# Patient Record
Sex: Female | Born: 1980 | Race: White | Hispanic: No | Smoking: Current every day smoker
Health system: Southern US, Community
[De-identification: ages and names within clinical notes are randomized; demographics above are authoritative.]

## PROBLEM LIST (undated history)

## (undated) DIAGNOSIS — F1120 Opioid dependence, uncomplicated: Secondary | ICD-10-CM

## (undated) DIAGNOSIS — N83209 Unspecified ovarian cyst, unspecified side: Secondary | ICD-10-CM

## (undated) HISTORY — PX: TUBAL LIGATION: SHX77

---

## 2004-01-19 ENCOUNTER — Emergency Department (HOSPITAL_COMMUNITY): Admission: EM | Admit: 2004-01-19 | Discharge: 2004-01-20 | Payer: Self-pay | Admitting: *Deleted

## 2005-05-10 ENCOUNTER — Emergency Department (HOSPITAL_COMMUNITY): Admission: EM | Admit: 2005-05-10 | Discharge: 2005-05-10 | Payer: Self-pay | Admitting: Emergency Medicine

## 2009-12-21 ENCOUNTER — Emergency Department (HOSPITAL_COMMUNITY): Admission: EM | Admit: 2009-12-21 | Discharge: 2009-12-21 | Payer: Self-pay | Admitting: Internal Medicine

## 2010-06-26 ENCOUNTER — Emergency Department (HOSPITAL_COMMUNITY)
Admission: EM | Admit: 2010-06-26 | Discharge: 2010-06-26 | Disposition: A | Discharge status: Home / Self Care | Payer: Self-pay | Attending: Emergency Medicine | Admitting: Emergency Medicine

## 2010-06-26 ENCOUNTER — Emergency Department (HOSPITAL_COMMUNITY): Payer: Self-pay

## 2010-06-26 DIAGNOSIS — R51 Headache: Secondary | ICD-10-CM | POA: Insufficient documentation

## 2010-06-26 DIAGNOSIS — R569 Unspecified convulsions: Secondary | ICD-10-CM | POA: Insufficient documentation

## 2010-06-26 LAB — DIFFERENTIAL
Basophils Absolute: 0 10*3/uL (ref 0.0–0.1)
Basophils Relative: 0 % (ref 0–1)
Eosinophils Relative: 5 % (ref 0–5)
Lymphocytes Relative: 32 % (ref 12–46)
Monocytes Absolute: 0.4 10*3/uL (ref 0.1–1.0)
Monocytes Relative: 6 % (ref 3–12)

## 2010-06-26 LAB — COMPREHENSIVE METABOLIC PANEL
ALT: 10 U/L (ref 0–35)
Albumin: 3.3 g/dL — ABNORMAL LOW (ref 3.5–5.2)
CO2: 25 mEq/L (ref 19–32)
Calcium: 8.6 mg/dL (ref 8.4–10.5)
Creatinine, Ser: 0.75 mg/dL (ref 0.4–1.2)
GFR calc Af Amer: >60 mL/min (ref >60)
Glucose, Bld: 91 mg/dL (ref 70–99)
Sodium: 134 mEq/L — ABNORMAL LOW (ref 135–145)

## 2010-06-26 LAB — URINALYSIS, ROUTINE W REFLEX MICROSCOPIC
Bilirubin Urine: NEGATIVE
Leukocytes, UA: NEGATIVE
Nitrite: NEGATIVE
Protein, ur: NEGATIVE mg/dL
Specific Gravity, Urine: 1.02 (ref 1.005–1.030)

## 2010-06-26 LAB — CBC
Hemoglobin: 12.6 g/dL (ref 12.0–15.0)
MCHC: 34.1 g/dL (ref 30.0–36.0)
MCV: 87.6 fL (ref 78.0–100.0)
Platelets: 239 10*3/uL (ref 150–400)
RDW: 12.5 % (ref 11.5–15.5)

## 2010-06-26 LAB — RAPID URINE DRUG SCREEN, HOSP PERFORMED
Amphetamines: NOT DETECTED
Opiates: NOT DETECTED

## 2010-06-26 LAB — URINE MICROSCOPIC-ADD ON

## 2010-08-09 ENCOUNTER — Other Ambulatory Visit (HOSPITAL_COMMUNITY): Payer: Self-pay | Admitting: Family Medicine

## 2010-08-09 DIAGNOSIS — M79661 Pain in right lower leg: Secondary | ICD-10-CM

## 2010-08-15 ENCOUNTER — Ambulatory Visit (HOSPITAL_COMMUNITY): Payer: Self-pay

## 2011-03-24 ENCOUNTER — Encounter: Payer: Self-pay | Admitting: *Deleted

## 2011-03-24 ENCOUNTER — Emergency Department (HOSPITAL_COMMUNITY)
Admission: EM | Admit: 2011-03-24 | Discharge: 2011-03-24 | Disposition: A | Discharge status: Home / Self Care | Payer: Self-pay | Attending: Emergency Medicine | Admitting: Emergency Medicine

## 2011-03-24 DIAGNOSIS — Z9851 Tubal ligation status: Secondary | ICD-10-CM | POA: Insufficient documentation

## 2011-03-24 DIAGNOSIS — M5430 Sciatica, unspecified side: Secondary | ICD-10-CM | POA: Insufficient documentation

## 2011-03-24 DIAGNOSIS — F17200 Nicotine dependence, unspecified, uncomplicated: Secondary | ICD-10-CM | POA: Insufficient documentation

## 2011-03-24 MED ORDER — CYCLOBENZAPRINE HCL 5 MG PO TABS: 5.0000 mg | ORAL_TABLET | Freq: Three times a day (TID) | ORAL | Status: AC | PRN

## 2011-03-24 MED ORDER — OXYCODONE-ACETAMINOPHEN 5-325 MG PO TABS
1.0000 | ORAL_TABLET | Freq: Once | ORAL | Status: AC
  Administered 2011-03-24: 1 via ORAL
  Filled 2011-03-24: qty 1

## 2011-03-24 MED ORDER — HYDROCODONE-ACETAMINOPHEN 5-325 MG PO TABS: 1.0000 | ORAL_TABLET | ORAL | Status: AC | PRN

## 2011-03-24 NOTE — ED Provider Notes (Signed)
History     CSN: [id number] Arrival date & time: 03/24/2011  4:05 PM   First MD Initiated Contact with Patient 03/24/11 1616      Chief Complaint  Patient presents with  . Back Pain    HPI Adrienne Levy is a 30 y.o. female who presents to the ED for lower back pain that radiates to the right hip. The pain started last night and is worse today. Denies loss of control over bladder or bowels. Has never had back and does not know of any injury other than playing leap frog with the children. The history was provided by the patient.  History reviewed. No pertinent past medical history.  Past Surgical History  Procedure Date  . Tubal ligation     History reviewed. No pertinent family history.  History  Substance Use Topics  . Smoking status: Current Everyday Smoker -- 1.0 packs/day  . Smokeless tobacco: Not on file  . Alcohol Use: No    OB History    Grav Para Term Preterm Abortions TAB SAB Ect Mult Living                  Review of Systems  Musculoskeletal: Positive for back pain.       Hip pain  All other systems reviewed and are negative.    Allergies  Toradol and Tramadol  Home Medications  No current outpatient prescriptions on file.  BP 132/111  Pulse 85  Temp 98.8 F (37.1 C)  Resp 20  Ht 5\' 5"  (1.651 m)  Wt 155 lb (70.308 kg)  BMI 25.79 kg/m2  SpO2 100%  LMP 03/10/2011  Physical Exam  Nursing note and vitals reviewed. Constitutional: She is oriented to person, place, and time. She appears well-developed and well-nourished.  HENT:  Head: Normocephalic and atraumatic.  Eyes: EOM are normal.  Neck: Neck supple.  Cardiovascular: Normal rate and regular rhythm.   Pulmonary/Chest: Effort normal and breath sounds normal.  Abdominal: Soft. There is no tenderness.  Musculoskeletal: Normal range of motion.       Back:       Pain with ROM lower back. Pain with palpation over sciatic nerve right. Pedal pulses +.  Neurological: She is alert and  oriented to person, place, and time. She has normal strength and normal reflexes. No cranial nerve deficit.  Skin: Skin is warm and dry.  Psychiatric: Her behavior is normal. Judgment and thought content normal.   Assessment:  Sciatica  Plan:  Flexeril 5 mg po tid   Hydrocodone   Follow up with PCP ED Course  Procedures   MDM          Kerrie Buffalo, NP 03/24/11 1629  [place], NP 03/24/11 1630

## 2011-03-24 NOTE — ED Notes (Signed)
Pt a/ox4. resp even and unlabored. NAD at this time. D/C instructions and rx reviewed with pt. Pt verbalized understanding. Pt ambulated to lobby with steady gate.

## 2011-03-24 NOTE — ED Notes (Signed)
Pt c/o lower back pain that radiates into her right hip. Pt denies injury.

## 2011-03-25 NOTE — ED Provider Notes (Signed)
Medical screening examination/treatment/procedure(s) were performed by non-physician practitioner and as supervising physician I was immediately available for consultation/collaboration.  Nicoletta Dress. Colon Branch, MD 03/25/11 1043

## 2011-07-15 ENCOUNTER — Encounter (HOSPITAL_COMMUNITY): Payer: Self-pay | Admitting: Emergency Medicine

## 2011-07-15 ENCOUNTER — Emergency Department (HOSPITAL_COMMUNITY)
Admission: EM | Admit: 2011-07-15 | Discharge: 2011-07-15 | Disposition: A | Discharge status: Home / Self Care | Payer: Self-pay | Attending: Physician Assistant | Admitting: Physician Assistant

## 2011-07-15 DIAGNOSIS — M545 Low back pain, unspecified: Secondary | ICD-10-CM | POA: Insufficient documentation

## 2011-07-15 DIAGNOSIS — S39012A Strain of muscle, fascia and tendon of lower back, initial encounter: Secondary | ICD-10-CM

## 2011-07-15 MED ORDER — ONDANSETRON HCL 4 MG PO TABS
4.0000 mg | ORAL_TABLET | Freq: Once | ORAL | Status: AC
  Administered 2011-07-15: 4 mg via ORAL
  Filled 2011-07-15: qty 1

## 2011-07-15 MED ORDER — HYDROCODONE-ACETAMINOPHEN 5-325 MG PO TABS
2.0000 | ORAL_TABLET | Freq: Once | ORAL | Status: AC
  Administered 2011-07-15: 2 via ORAL
  Filled 2011-07-15: qty 2

## 2011-07-15 MED ORDER — DIAZEPAM 5 MG PO TABS
5.0000 mg | ORAL_TABLET | Freq: Once | ORAL | Status: AC
  Administered 2011-07-15: 5 mg via ORAL
  Filled 2011-07-15: qty 1

## 2011-07-15 MED ORDER — BACLOFEN 10 MG PO TABS: 10.0000 mg | ORAL_TABLET | Freq: Three times a day (TID) | ORAL | Status: AC

## 2011-07-15 MED ORDER — HYDROCODONE-ACETAMINOPHEN 7.5-325 MG PO TABS: 1.0000 | ORAL_TABLET | ORAL | Status: AC | PRN

## 2011-07-15 MED ORDER — DEXAMETHASONE 4 MG PO TABS: ORAL_TABLET | ORAL | Status: AC

## 2011-07-15 NOTE — Discharge Instructions (Signed)
Please alternate heat and ice to the lower back. Please rest her back as much as possible. Decadron 4 mg daily, please take with food. Baclofen 3 times daily for spasm. Norco every 4 hours as needed for pain. Baclofen and Norco may cause drowsiness, please use with caution. If pain persist after treatment with the above listed medications, please see your primary physician or the orthopedic physician listed above for additional evaluation and assistance.Muscle Strain A muscle strain (pulled muscle) happens when a muscle is over-stretched. Recovery usually takes 5 to 6 weeks.  HOME CARE   Put ice on the injured area.   Put ice in a plastic bag.   Place a towel between your skin and the bag.   Leave the ice on for 15 to 20 minutes at a time, every hour for the first 2 days.   Do not use the muscle for several days or until your doctor says you can. Do not use the muscle if you have pain.   Wrap the injured area with an elastic bandage for comfort. Do not put it on too tightly.   Only take medicine as told by your doctor.   Warm up before exercise. This helps prevent muscle strains.  GET HELP RIGHT AWAY IF:  There is increased pain or puffiness (swelling) in the affected area. MAKE SURE YOU:   Understand these instructions.   Will watch your condition.   Will get help right away if you are not doing well or get worse.  Document Released: 01/24/2008 Document Revised: 04/05/2011 Document Reviewed: 01/24/2008 [facility] Patient Information 2012 [place], [place].

## 2011-07-15 NOTE — ED Notes (Signed)
Pt a/ox4. resp even and unlabored. NAD at this time. D/C instructions reviewed with pt. Pt verbalized understanding. Pt ambulated to lobby with steady gate.  

## 2011-07-15 NOTE — ED Notes (Signed)
Pt presents with flare up of low back pain that radiates down right leg. Pt ambulates with steady gate. No deformity or step-off noted. Pt sitting up on edge of stretcher. Stretcher in low locked position. Side rail up for pt safety. Call light within reach. Education on plan of care provided. Pt verbalized understanding. Awaiting PA evaluation. Will continue to monitor.

## 2011-07-15 NOTE — ED Notes (Signed)
Pt c/o lower back pain radiating down right leg.  

## 2011-07-22 NOTE — ED Provider Notes (Signed)
History     CSN: [id number]  Arrival date & time 07/15/11  1723   None     Chief Complaint  Patient presents with  . Back Pain    (Consider location/radiation/quality/duration/timing/severity/associated sxs/prior treatment) Patient is a 31 y.o. female presenting with back pain. The history is provided by the patient.  Back Pain  This is a new problem. The problem occurs daily. The pain is associated with no known injury. The pain is present in the lumbar spine. The quality of the pain is described as aching and burning. The pain radiates to the right thigh, right knee and right foot. The pain is severe. The symptoms are aggravated by certain positions. The pain is the same all the time. Pertinent negatives include no chest pain, no abdominal pain and no dysuria. She has tried nothing for the symptoms.    History reviewed. No pertinent past medical history.  Past Surgical History  Procedure Date  . Tubal ligation     History reviewed. No pertinent family history.  History  Substance Use Topics  . Smoking status: Current Everyday Smoker -- 1.0 packs/day  . Smokeless tobacco: Not on file  . Alcohol Use: No    OB History    Grav Para Term Preterm Abortions TAB SAB Ect Mult Living                  Review of Systems  Constitutional: Negative for activity change.       All ROS Neg except as noted in HPI  HENT: Negative for nosebleeds and neck pain.   Eyes: Negative for photophobia and discharge.  Respiratory: Negative for cough, shortness of breath and wheezing.   Cardiovascular: Negative for chest pain and palpitations.  Gastrointestinal: Negative for abdominal pain and blood in stool.  Genitourinary: Negative for dysuria, frequency and hematuria.  Musculoskeletal: Positive for back pain. Negative for arthralgias.  Skin: Negative.   Neurological: Negative for dizziness, seizures and speech difficulty.  Psychiatric/Behavioral: Negative for hallucinations and confusion.      Allergies  Toradol and Tramadol  Home Medications   Current Outpatient Rx  Name Route Sig Dispense Refill  . BACLOFEN 10 MG PO TABS Oral Take 1 tablet (10 mg total) by mouth 3 (three) times daily. 21 each 0  . DEXAMETHASONE 4 MG PO TABS  1 po daily with food 6 tablet 0  . HYDROCODONE-ACETAMINOPHEN 7.5-325 MG PO TABS Oral Take 1 tablet by mouth every 4 (four) hours as needed for pain. 20 tablet 0    BP 141/93  Pulse 100  Temp(Src) 97.3 F (36.3 C) (Oral)  Resp 20  Ht 5\' 6"  (1.676 m)  Wt 155 lb (70.308 kg)  BMI 25.02 kg/m2  SpO2 100%  LMP 07/01/2011  Physical Exam  Nursing note and vitals reviewed. Constitutional: She is oriented to person, place, and time. She appears well-developed and well-nourished.  Non-toxic appearance.  HENT:  Head: Normocephalic.  Right Ear: Tympanic membrane and external ear normal.  Left Ear: Tympanic membrane and external ear normal.  Eyes: EOM and lids are normal. Pupils are equal, round, and reactive to light.  Neck: Normal range of motion. Neck supple. Carotid bruit is not present.  Cardiovascular: Normal rate, regular rhythm, normal heart sounds, intact distal pulses and normal pulses.   Pulmonary/Chest: Breath sounds normal. No respiratory distress.  Abdominal: Soft. Bowel sounds are normal. There is no tenderness. There is no guarding.  Musculoskeletal: Normal range of motion.  Lumbar area pain to palpation. No palpable deformity.  Lymphadenopathy:       Head (right side): No submandibular adenopathy present.       Head (left side): No submandibular adenopathy present.    She has no cervical adenopathy.  Neurological: She is alert and oriented to person, place, and time. She has normal strength. No cranial nerve deficit or sensory deficit. She exhibits normal muscle tone. Coordination normal.  Skin: Skin is warm and dry.  Psychiatric: She has a normal mood and affect. Her speech is normal.    ED Course  Procedures (including  critical care time)  Labs Reviewed - No data to display No results found.   1. Lumbar strain       MDM  I have reviewed nursing notes, vital signs, and all appropriate lab and imaging results for this patient. No gross neurologic deficit appreciated on today's exam. Prescription for Decadron 4 mg, and Norco 7.5 mg given to the patient.       Kathie Dike, [place] 07/24/11 [phone]

## 2011-07-24 NOTE — ED Provider Notes (Signed)
Medical screening examination/treatment/procedure(s) were performed by non-physician practitioner and as supervising physician I was immediately available for consultation/collaboration.    Nelia Shi, MD 07/24/11 1242

## 2011-08-21 ENCOUNTER — Emergency Department (HOSPITAL_COMMUNITY)
Admission: EM | Admit: 2011-08-21 | Discharge: 2011-08-21 | Disposition: A | Discharge status: Home / Self Care | Payer: Self-pay | Attending: Emergency Medicine | Admitting: Emergency Medicine

## 2011-08-21 ENCOUNTER — Encounter (HOSPITAL_COMMUNITY): Payer: Self-pay | Admitting: *Deleted

## 2011-08-21 DIAGNOSIS — F17200 Nicotine dependence, unspecified, uncomplicated: Secondary | ICD-10-CM | POA: Insufficient documentation

## 2011-08-21 DIAGNOSIS — M545 Low back pain, unspecified: Secondary | ICD-10-CM | POA: Insufficient documentation

## 2011-08-21 DIAGNOSIS — R51 Headache: Secondary | ICD-10-CM | POA: Insufficient documentation

## 2011-08-21 MED ORDER — OXYCODONE-ACETAMINOPHEN 5-325 MG PO TABS: 1.0000 | ORAL_TABLET | Freq: Four times a day (QID) | ORAL | Status: AC | PRN

## 2011-08-21 MED ORDER — CYCLOBENZAPRINE HCL 10 MG PO TABS: 10.0000 mg | ORAL_TABLET | Freq: Two times a day (BID) | ORAL | Status: AC | PRN

## 2011-08-21 MED ORDER — HYDROMORPHONE HCL PF 2 MG/ML IJ SOLN
2.0000 mg | Freq: Once | INTRAMUSCULAR | Status: AC
  Administered 2011-08-21: 2 mg via INTRAMUSCULAR
  Filled 2011-08-21: qty 1

## 2011-08-21 NOTE — Discharge Instructions (Signed)
Back Pain, Adult Low back pain is very common. About 1 in 5 people have back pain.The cause of low back pain is rarely dangerous. The pain often gets better over time.About half of people with a sudden onset of back pain feel better in just 2 weeks. About 8 in 10 people feel better by 6 weeks.  CAUSES Some common causes of back pain include:  Strain of the muscles or ligaments supporting the spine.   Wear and tear (degeneration) of the spinal discs.   Arthritis.   Direct injury to the back.  DIAGNOSIS Most of the time, the direct cause of low back pain is not known.However, back pain can be treated effectively even when the exact cause of the pain is unknown.Answering your caregiver's questions about your overall health and symptoms is one of the most accurate ways to make sure the cause of your pain is not dangerous. If your caregiver needs more information, he or she may order lab work or imaging tests (X-rays or MRIs).However, even if imaging tests show changes in your back, this usually does not require surgery. HOME CARE INSTRUCTIONS For many people, back pain returns.Since low back pain is rarely dangerous, it is often a condition that people can learn to manageon their own.   Remain active. It is stressful on the back to sit or stand in one place. Do not sit, drive, or stand in one place for more than 30 minutes at a time. Take short walks on level surfaces as soon as pain allows.Try to increase the length of time you walk each day.   Do not stay in bed.Resting more than 1 or 2 days can delay your recovery.   Do not avoid exercise or work.Your body is made to move.It is not dangerous to be active, even though your back may hurt.Your back will likely heal faster if you return to being active before your pain is gone.   Pay attention to your body when you bend and lift. Many people have less discomfortwhen lifting if they bend their knees, keep the load close to their  bodies,and avoid twisting. Often, the most comfortable positions are those that put less stress on your recovering back.   Find a comfortable position to sleep. Use a firm mattress and lie on your side with your knees slightly bent. If you lie on your back, put a pillow under your knees.   Only take over-the-counter or prescription medicines as directed by your caregiver. Over-the-counter medicines to reduce pain and inflammation are often the most helpful.Your caregiver may prescribe muscle relaxant drugs.These medicines help dull your pain so you can more quickly return to your normal activities and healthy exercise.   Put ice on the injured area.   Put ice in a plastic bag.   Place a towel between your skin and the bag.   Leave the ice on for 15 to 20 minutes, 3 to 4 times a day for the first 2 to 3 days. After that, ice and heat may be alternated to reduce pain and spasms.   Ask your caregiver about trying back exercises and gentle massage. This may be of some benefit.   Avoid feeling anxious or stressed.Stress increases muscle tension and can worsen back pain.It is important to recognize when you are anxious or stressed and learn ways to manage it.Exercise is a great option.  SEEK MEDICAL CARE IF:  You have pain that is not relieved with rest or medicine.   You have   pain that does not improve in 1 week.   You have new symptoms.   You are generally not feeling well.  SEEK IMMEDIATE MEDICAL CARE IF:   You have pain that radiates from your back into your legs.   You develop new bowel or bladder control problems.   You have unusual weakness or numbness in your arms or legs.   You develop nausea or vomiting.   You develop abdominal pain.   You feel faint.  Document Released: 04/16/2005 Document Revised: 04/05/2011 Document Reviewed: 09/04/2010 [facility] Patient Information 2012 [place], [place].   RESOURCE GUIDE  Dental Problems  Patients with Medicaid: [facility] [phone]                                           [phone] [address] Phone:  [phone]                                                  Phone:  [phone]  If unable to pay or uninsured, contact:  [facility] or [facility] to become qualified for the adult dental clinic.  Chronic Pain Problems Contact [facility]  [phone] Patients need to be referred by their primary care doctor.  Insufficient Money for Medicine Contact [employer]:  call "211" or [facility] 712-800-8803.  No Primary Care Doctor Call [facility]  2195377323 Other agencies that provide inexpensive medical care    [facility]  [phone]    [facility]  4088316133    [facility]  [phone]    [facility]  [phone]    [facility]  [phone]    [facility]  [phone]  Psychological Services [facility]  [phone] [facility]  [phone] [facility]   [phone] (emergency services 7316097213)  Substance Abuse Resources [facility]  (339) 240-2317 [facility] 8728754214 [facility] 272-799-1471 Floydene Flock (707)[phone] Residential & Outpatient Substance Abuse Program  212-207-9615  Abuse/Neglect [facility] Child Abuse Hotline 614-409-5984 [facility] Child Abuse Hotline (782) 616-3002 (After Hours)  Emergency Shelter [facility] 702-635-8074  [facility] (256)808-9150 [facility] 435-624-6420  MRSA Hotline #:   604-388-2673    [facility] Resources  [facility]  [place]     United [employer]                          [facility] Dept. [address] [place]                       [address]      [address]  [address]  Cristobal Goldmann Phone:  [phone]                                   Phone:  [phone]                 Phone:  [phone]  [facility] Phone:  [phone]  [facility] Child Abuse Hotline 574-555-6021 218-491-4925 (After Hours)  Rest take medications as directed. Followup using the resource guide to find a primary care provider. Return for new or worse symptoms. Return for any bowel incontinence or radiation of pain into the lower extremities or weakness or numbness in the foot.

## 2011-08-21 NOTE — ED Provider Notes (Signed)
History   This chart was scribed for Adrienne Jakes, MD by Brooks Sailors. The patient was seen in room APA08/APA08. Patient's care was started at 2142.   CSN: [id number]  Arrival date & time 08/21/11  2142   First MD Initiated Contact with Patient 08/21/11 2201      Chief Complaint  Patient presents with  . Back Pain    (Consider location/radiation/quality/duration/timing/severity/associated sxs/prior treatment) HPI  Adrienne Levy is a 31 y.o. female who presents to the Emergency Department complaining of moderate to severe back pain onset two days ago and persistent since. Patient has had less severe intermittent back pain for the past two weeks. The pain is localized to the left lower back and does not radiate to any other areas. Patient denies injury to the back and denies any other medical problems.   History reviewed. No pertinent past medical history.  Past Surgical History  Procedure Date  . Tubal ligation     No family history on file.  History  Substance Use Topics  . Smoking status: Current Everyday Smoker -- 1.0 packs/day  . Smokeless tobacco: Not on file  . Alcohol Use: No    OB History    Grav Para Term Preterm Abortions TAB SAB Ect Mult Living                  Review of Systems  Constitutional: Negative for fever.  HENT: Negative for congestion, sore throat and neck pain.   Respiratory: Negative for cough.   Cardiovascular: Negative for chest pain.  Gastrointestinal: Negative for nausea, vomiting and diarrhea.  Genitourinary: Negative for dysuria.  Musculoskeletal: Positive for back pain. Negative for joint swelling.  Skin: Negative for rash.  Neurological: Positive for headaches.  Hematological: Does not bruise/bleed easily.  All other systems reviewed and are negative.    Allergies  Toradol and Tramadol  Home Medications   Current Outpatient Rx  Name Route Sig Dispense Refill  . ACETAMINOPHEN 325 MG PO TABS Oral Take 650 mg by  mouth as needed. For pain    . CYCLOBENZAPRINE HCL 10 MG PO TABS Oral Take 1 tablet (10 mg total) by mouth 2 (two) times daily as needed for muscle spasms. 20 tablet 0  . OXYCODONE-ACETAMINOPHEN 5-325 MG PO TABS Oral Take 1-2 tablets by mouth every 6 (six) hours as needed for pain. 14 tablet 0    BP 121/92  Pulse 94  Temp(Src) 97.8 F (36.6 C) (Oral)  Resp 20  Ht 5\' 5"  (1.651 m)  Wt 160 lb (72.576 kg)  BMI 26.63 kg/m2  SpO2 100%  LMP 07/30/2011  Physical Exam  Nursing note and vitals reviewed. Constitutional: She is oriented to person, place, and time. She appears well-developed and well-nourished.  HENT:  Head: Normocephalic and atraumatic.  Eyes: Conjunctivae and EOM are normal. Pupils are equal, round, and reactive to light.  Neck: Normal range of motion. Neck supple.  Cardiovascular: Normal rate, regular rhythm and normal heart sounds.  Exam reveals no gallop and no friction rub.   No murmur heard. Pulmonary/Chest: Effort normal and breath sounds normal. No respiratory distress. She has no wheezes. She has no rales.  Abdominal: Soft. Bowel sounds are normal.  Musculoskeletal: Normal range of motion. She exhibits tenderness (midline tenderness in lumbar area).       Mild Paraspinous tenderness in the right, more severe tenderness in the left.   Neurological: She is alert and oriented to person, place, and time.  Skin: Skin  is warm and dry.  Psychiatric: She has a normal mood and affect.    ED Course  Procedures (including critical care time) DIAGNOSTIC STUDIES: Oxygen Saturation is 100% on room air, normal by my interpretation.    COORDINATION OF CARE: 10:18PM Ordering pain medication.    Labs Reviewed - No data to display No results found. .this  1. Lumbar back pain       MDM  Patient with acute exacerbation of lumbar back pain. Starting 2 days ago pain mostly to the left side no distinct muscle spasm no lower extremity neurological deficits to be consistent  with sciatica. No radiation of pain into the lower extremities. Improves some with IM Dilaudid in the emergency department. Will discharge home with pain medication and Flexeril. Resource guide provided to patient find a primary care Dr. Andrey Cota she is not currently. Today's findings with no complicating factors no incontinence. Nothing consistent with cauda equina syndrome.      I personally performed the services described in this documentation, which was scribed in my presence. The recorded information has been reviewed and considered.     Adrienne Jakes, MD 08/21/11 [phone]

## 2011-08-21 NOTE — ED Notes (Signed)
Back pain x 2 days °

## 2011-10-05 ENCOUNTER — Emergency Department (HOSPITAL_COMMUNITY): Payer: Self-pay

## 2011-10-05 ENCOUNTER — Emergency Department (HOSPITAL_COMMUNITY)
Admission: EM | Admit: 2011-10-05 | Discharge: 2011-10-05 | Disposition: A | Discharge status: Home / Self Care | Payer: Self-pay | Attending: Emergency Medicine | Admitting: Emergency Medicine

## 2011-10-05 ENCOUNTER — Encounter (HOSPITAL_COMMUNITY): Payer: Self-pay | Admitting: *Deleted

## 2011-10-05 DIAGNOSIS — F17200 Nicotine dependence, unspecified, uncomplicated: Secondary | ICD-10-CM | POA: Insufficient documentation

## 2011-10-05 DIAGNOSIS — R1031 Right lower quadrant pain: Secondary | ICD-10-CM | POA: Insufficient documentation

## 2011-10-05 DIAGNOSIS — R109 Unspecified abdominal pain: Secondary | ICD-10-CM

## 2011-10-05 DIAGNOSIS — Z79899 Other long term (current) drug therapy: Secondary | ICD-10-CM | POA: Insufficient documentation

## 2011-10-05 HISTORY — DX: Unspecified ovarian cyst, unspecified side: N83.209

## 2011-10-05 LAB — COMPREHENSIVE METABOLIC PANEL WITH GFR
ALT: 16 U/L (ref 0–35)
AST: 21 U/L (ref 0–37)
Albumin: 3.8 g/dL (ref 3.5–5.2)
Alkaline Phosphatase: 71 U/L (ref 39–117)
BUN: 4 mg/dL — ABNORMAL LOW (ref 6–23)
CO2: 25 meq/L (ref 19–32)
Calcium: 9.3 mg/dL (ref 8.4–10.5)
Chloride: 102 meq/L (ref 96–112)
Creatinine, Ser: 0.64 mg/dL (ref 0.50–1.10)
GFR calc Af Amer: >90 mL/min
GFR calc non Af Amer: >90 mL/min
Glucose, Bld: 105 mg/dL — ABNORMAL HIGH (ref 70–99)
Potassium: 3.3 meq/L — ABNORMAL LOW (ref 3.5–5.1)
Sodium: 139 meq/L (ref 135–145)
Total Bilirubin: 0.3 mg/dL (ref 0.3–1.2)
Total Protein: 7.7 g/dL (ref 6.0–8.3)

## 2011-10-05 LAB — DIFFERENTIAL
Basophils Relative: 0 % (ref 0–1)
Eosinophils Relative: 3 % (ref 0–5)
Lymphs Abs: 2.9 10*3/uL (ref 0.7–4.0)
Monocytes Absolute: 0.4 10*3/uL (ref 0.1–1.0)
Monocytes Relative: 5 % (ref 3–12)
Neutrophils Relative %: 59 % (ref 43–77)

## 2011-10-05 LAB — URINE MICROSCOPIC-ADD ON

## 2011-10-05 LAB — CBC
HCT: 43.6 % (ref 36.0–46.0)
Hemoglobin: 15 g/dL (ref 12.0–15.0)
MCH: 30.4 pg (ref 26.0–34.0)
MCHC: 34.4 g/dL (ref 30.0–36.0)
MCV: 88.4 fL (ref 78.0–100.0)
Platelets: 303 K/uL (ref 150–400)
RBC: 4.93 MIL/uL (ref 3.87–5.11)
RDW: 12.7 % (ref 11.5–15.5)
WBC: 8.5 K/uL (ref 4.0–10.5)

## 2011-10-05 LAB — URINALYSIS, ROUTINE W REFLEX MICROSCOPIC
Bilirubin Urine: NEGATIVE
Ketones, ur: NEGATIVE mg/dL
Leukocytes, UA: NEGATIVE

## 2011-10-05 MED ORDER — IOHEXOL 300 MG/ML  SOLN
100.0000 mL | Freq: Once | INTRAMUSCULAR | Status: AC | PRN
  Administered 2011-10-05: 100 mL via INTRAVENOUS

## 2011-10-05 MED ORDER — ONDANSETRON HCL 4 MG/2ML IJ SOLN
4.0000 mg | Freq: Once | INTRAMUSCULAR | Status: AC
  Administered 2011-10-05: 4 mg via INTRAVENOUS
  Filled 2011-10-05: qty 2

## 2011-10-05 MED ORDER — MORPHINE SULFATE 2 MG/ML IJ SOLN
6.0000 mg | Freq: Once | INTRAMUSCULAR | Status: AC
  Administered 2011-10-05: 6 mg via INTRAVENOUS
  Filled 2011-10-05: qty 3

## 2011-10-05 MED ORDER — HYDROMORPHONE HCL PF 1 MG/ML IJ SOLN
1.0000 mg | Freq: Once | INTRAMUSCULAR | Status: AC
  Administered 2011-10-05: 1 mg via INTRAVENOUS
  Filled 2011-10-05: qty 1

## 2011-10-05 MED ORDER — OXYCODONE-ACETAMINOPHEN 5-325 MG PO TABS: 1.0000 | ORAL_TABLET | Freq: Four times a day (QID) | ORAL | Status: AC | PRN

## 2011-10-05 NOTE — Discharge Instructions (Signed)
Follow up with dr. eure next week. °

## 2011-10-05 NOTE — ED Notes (Signed)
Pt c/o right lower quad abd pain that started three days ago when she started her period increasing last night, pt denies any fever, n/v

## 2011-10-05 NOTE — ED Provider Notes (Signed)
This chart was scribed for [name], MD by Wallis Mart. The patient was seen in room APA11/APA11 and the patient's care was started at 3:19 PM.   CSN: [id number]  Arrival date & time 10/05/11  1450   First MD Initiated Contact with Patient 10/05/11 1515      Chief Complaint  Patient presents with  . Abdominal Pain    (Consider location/radiation/quality/duration/timing/severity/associated sxs/prior treatment) Patient is a 31 y.o. female presenting with abdominal pain.  Abdominal Pain The primary symptoms of the illness include abdominal pain. The primary symptoms of the illness do not include nausea, vomiting, diarrhea or dysuria. The current episode started more than 2 days ago. The onset of the illness was sudden. The problem has been gradually worsening.  Associated with: menstruation. The patient states that she believes she is currently not pregnant. The patient has not had a change in bowel habit. Symptoms associated with the illness do not include chills, urgency or frequency. Significant associated medical issues do not include GERD, diabetes or diverticulitis.    Adrienne Levy is a 31 y.o. female who presents to the Emergency Department complaining of sudden onset, persistence of constant, gradually worsening, moderate to severe RLQ abd pain that radiates nowhere. The pain started 3 days ago when pt started her period but increased last night.  Pt denies any trouble urinating.  Pt had an ovarian cyst w/ similar but milder pain. Pt denies vomiting, fever.  There are no other associated symptoms and no other alleviating or aggravating factors.   Past Medical History  Diagnosis Date  . Ovarian cyst     Past Surgical History  Procedure Date  . Tubal ligation     No family history on file.  History  Substance Use Topics  . Smoking status: Current Everyday Smoker -- 1.0 packs/day  . Smokeless tobacco: Not on file  . Alcohol Use: No    OB History    Grav  Para Term Preterm Abortions TAB SAB Ect Mult Living                  Review of Systems  Constitutional: Negative for chills.  Gastrointestinal: Positive for abdominal pain. Negative for nausea, vomiting and diarrhea.  Genitourinary: Negative for dysuria, urgency and frequency.  All other systems reviewed and are negative.    10 Systems reviewed and all are negative for acute change except as noted in the HPI.    Allergies  Ketorolac tromethamine and Tramadol  Home Medications   Current Outpatient Rx  Name Route Sig Dispense Refill  . ACETAMINOPHEN 325 MG PO TABS Oral Take 650 mg by mouth as needed. For pain      BP 145/101  Pulse 116  Temp(Src) 97.9 F (36.6 C) (Oral)  Resp 20  Ht 5\' 5"  (1.651 m)  Wt 160 lb (72.576 kg)  BMI 26.63 kg/m2  SpO2 100%  LMP 10/05/2011  Physical Exam  Nursing note and vitals reviewed. Constitutional: She is oriented to person, place, and time. She appears well-developed and well-nourished. No distress.  HENT:  Head: Normocephalic and atraumatic.  Eyes: EOM are normal. Pupils are equal, round, and reactive to light.  Neck: Normal range of motion. Neck supple. No tracheal deviation present.  Cardiovascular: Normal rate and regular rhythm.   Pulmonary/Chest: Effort normal and breath sounds normal. No respiratory distress.  Abdominal: Soft. She exhibits no distension. There is tenderness.       Moderate RLQ tenderness  Musculoskeletal: Normal range of motion.  She exhibits no edema.  Neurological: She is alert and oriented to person, place, and time. No sensory deficit.  Skin: Skin is warm and dry.  Psychiatric: She has a normal mood and affect. Her behavior is normal.  pelvic exam tender right adenexal  ED Course  Procedures (including critical care time)  DIAGNOSTIC STUDIES: Oxygen Saturation is 100% on room air, normal by my interpretation.    COORDINATION OF CARE:    Labs Reviewed - No data to display No results found.   No  diagnosis found.    MDM       [name], MD 10/05/11 [phone]

## 2011-10-08 LAB — GC/CHLAMYDIA PROBE AMP, GENITAL
Chlamydia, DNA Probe: NEGATIVE
GC Probe Amp, Genital: NEGATIVE

## 2011-11-13 ENCOUNTER — Emergency Department (HOSPITAL_COMMUNITY)
Admission: EM | Admit: 2011-11-13 | Discharge: 2011-11-13 | Disposition: A | Discharge status: Home / Self Care | Payer: Self-pay | Attending: Emergency Medicine | Admitting: Emergency Medicine

## 2011-11-13 ENCOUNTER — Emergency Department (HOSPITAL_COMMUNITY): Payer: Self-pay

## 2011-11-13 ENCOUNTER — Encounter (HOSPITAL_COMMUNITY): Payer: Self-pay

## 2011-11-13 DIAGNOSIS — R1031 Right lower quadrant pain: Secondary | ICD-10-CM | POA: Insufficient documentation

## 2011-11-13 DIAGNOSIS — N83209 Unspecified ovarian cyst, unspecified side: Secondary | ICD-10-CM | POA: Insufficient documentation

## 2011-11-13 DIAGNOSIS — N760 Acute vaginitis: Secondary | ICD-10-CM | POA: Insufficient documentation

## 2011-11-13 DIAGNOSIS — B9689 Other specified bacterial agents as the cause of diseases classified elsewhere: Secondary | ICD-10-CM | POA: Insufficient documentation

## 2011-11-13 DIAGNOSIS — A499 Bacterial infection, unspecified: Secondary | ICD-10-CM | POA: Insufficient documentation

## 2011-11-13 LAB — CBC WITH DIFFERENTIAL/PLATELET
Basophils Relative: 0 % (ref 0–1)
Eosinophils Absolute: 0.2 10*3/uL (ref 0.0–0.7)
Lymphs Abs: 3.1 10*3/uL (ref 0.7–4.0)
MCH: 30.3 pg (ref 26.0–34.0)
Neutrophils Relative %: 50 % (ref 43–77)
Platelets: 260 10*3/uL (ref 150–400)
RBC: 4.56 MIL/uL (ref 3.87–5.11)
WBC: 7.6 10*3/uL (ref 4.0–10.5)

## 2011-11-13 LAB — URINALYSIS, ROUTINE W REFLEX MICROSCOPIC
Bilirubin Urine: NEGATIVE
Glucose, UA: NEGATIVE mg/dL
Ketones, ur: NEGATIVE mg/dL
pH: 5.5 (ref 5.0–8.0)

## 2011-11-13 LAB — COMPREHENSIVE METABOLIC PANEL
ALT: 19 U/L (ref 0–35)
Albumin: 3.6 g/dL (ref 3.5–5.2)
Alkaline Phosphatase: 64 U/L (ref 39–117)
Glucose, Bld: 89 mg/dL (ref 70–99)
Potassium: 3.5 mEq/L (ref 3.5–5.1)
Sodium: 134 mEq/L — ABNORMAL LOW (ref 135–145)
Total Protein: 7.1 g/dL (ref 6.0–8.3)

## 2011-11-13 LAB — PREGNANCY, URINE: Preg Test, Ur: NEGATIVE

## 2011-11-13 MED ORDER — HYDROMORPHONE HCL PF 2 MG/ML IJ SOLN
INTRAMUSCULAR | Status: AC
  Filled 2011-11-13: qty 1

## 2011-11-13 MED ORDER — HYDROMORPHONE HCL PF 1 MG/ML IJ SOLN
1.0000 mg | Freq: Once | INTRAMUSCULAR | Status: AC
  Administered 2011-11-13: 1 mg via INTRAVENOUS
  Filled 2011-11-13: qty 1

## 2011-11-13 MED ORDER — SODIUM CHLORIDE 0.9 % IV SOLN
INTRAVENOUS | Status: DC
  Administered 2011-11-13: 16:00:00 via INTRAVENOUS

## 2011-11-13 MED ORDER — METRONIDAZOLE 500 MG PO TABS: 500.0000 mg | ORAL_TABLET | Freq: Two times a day (BID) | ORAL | Status: AC

## 2011-11-13 MED ORDER — OXYCODONE-ACETAMINOPHEN 5-325 MG PO TABS: 1.0000 | ORAL_TABLET | ORAL | Status: AC | PRN

## 2011-11-13 MED ORDER — HYDROMORPHONE HCL PF 2 MG/ML IJ SOLN
2.0000 mg | Freq: Once | INTRAMUSCULAR | Status: AC
  Administered 2011-11-13: 2 mg via INTRAVENOUS

## 2011-11-13 MED ORDER — METRONIDAZOLE 500 MG PO TABS
500.0000 mg | ORAL_TABLET | Freq: Once | ORAL | Status: AC
  Administered 2011-11-13: 500 mg via ORAL
  Filled 2011-11-13: qty 1

## 2011-11-13 MED ORDER — ONDANSETRON HCL 4 MG/2ML IJ SOLN
4.0000 mg | Freq: Once | INTRAMUSCULAR | Status: AC
  Administered 2011-11-13: 4 mg via INTRAVENOUS
  Filled 2011-11-13: qty 2

## 2011-11-13 NOTE — ED Notes (Signed)
Pt with RLQ pain severe x 2 days, first started this first week of the month per pt., denies N/V/D

## 2011-11-13 NOTE — ED Notes (Signed)
Patient transported to Ultrasound 

## 2011-11-13 NOTE — ED Provider Notes (Cosign Needed)
History    This chart was scribed for Adrienne Human, MD, MD by Smitty Pluck. The patient was seen in room APA10 and the patient's care was started at 3:36PM.   CSN: [id number]  Arrival date & time 11/13/11  1355   First MD Initiated Contact with Patient 11/13/11 1519      Chief Complaint  Patient presents with  . Abdominal Pain    (Consider location/radiation/quality/duration/timing/severity/associated sxs/prior treatment) Patient is a 31 y.o. female presenting with abdominal pain. The history is provided by the patient.  Abdominal Pain The primary symptoms of the illness include abdominal pain.   Adrienne Levy is a 31 y.o. female who presents to the Emergency Department complaining of moderate, constant stabbing RLQ pain onset 2 days. Pt reports taking percocet without relief. Denies vomiting, constipation, diarrhea, dysuria, nausea. Pt reports having LMP 1 week ago. Reports she had bad cramping during menstrual cycle. Reports that she normally in good health. Pt reports that she had tubal ligation in 2005. Pt is allergic to tramadol. Pt denies being on medication. Reports smoking 1 pack/day cigarettes. Denies alcohol and drug use. Denies fever, earache, sore throat, chest pain, cough, crash, syncope and seizures. Pt was seen in ED 10/05/11 for similar symptoms.   Past Medical History  Diagnosis Date  . Ovarian cyst     Past Surgical History  Procedure Date  . Tubal ligation     No family history on file.  History  Substance Use Topics  . Smoking status: Current Everyday Smoker -- 1.0 packs/day  . Smokeless tobacco: Not on file  . Alcohol Use: No    OB History    Grav Para Term Preterm Abortions TAB SAB Ect Mult Living                  Review of Systems  Gastrointestinal: Positive for abdominal pain.  All other systems reviewed and are negative.  10 Systems reviewed and all are negative for acute change except as noted in the HPI.    Allergies  Ketorolac  tromethamine and Tramadol  Home Medications   Current Outpatient Rx  Name Route Sig Dispense Refill  . ACETAMINOPHEN 325 MG PO TABS Oral Take 650 mg by mouth as needed. For pain    . OXYCODONE-ACETAMINOPHEN 5-325 MG PO TABS Oral Take 1 tablet by mouth as needed.      BP 119/90  Pulse 97  Temp 97.7 F (36.5 C) (Oral)  Resp 18  Ht 5\' 5"  (1.651 m)  Wt 160 lb (72.576 kg)  BMI 26.63 kg/m2  SpO2 98%  LMP 10/29/2011  Physical Exam  Nursing note and vitals reviewed. Constitutional: She is oriented to person, place, and time. She appears well-developed and well-nourished. No distress.  HENT:  Head: Normocephalic and atraumatic.  Eyes: EOM are normal. Pupils are equal, round, and reactive to light.  Neck: Normal range of motion. Neck supple. No tracheal deviation present.  Cardiovascular: Normal rate, regular rhythm and normal heart sounds.   Pulmonary/Chest: Effort normal. No respiratory distress.  Abdominal: Soft. She exhibits no distension. There is tenderness (RLQ).  Musculoskeletal: Normal range of motion.  Neurological: She is alert and oriented to person, place, and time.  Skin: Skin is warm and dry.  Psychiatric: She has a normal mood and affect. Her behavior is normal.    ED Course  Pelvic exam Date/Time: 11/13/2011 5:46 PM Performed by: Adrienne Levy Authorized by: Adrienne Levy Consent: Verbal consent obtained. Written consent not  obtained. Risks and benefits: risks, benefits and alternatives were discussed Consent given by: patient Patient understanding: patient states understanding of the procedure being performed Patient consent: the patient's understanding of the procedure matches consent given Patient identity confirmed: verbally with patient Time out: Immediately prior to procedure a "time out" was called to verify the correct patient, procedure, equipment, support staff and site/side marked as required. Local anesthesia used: no Patient sedated:  no Patient tolerance: Patient tolerated the procedure well with no immediate complications. Comments: BUS negative.  Bimanual shows mild right adnexal tenderness, no uterine tenderness.  Has white mucoid discharge.  Specimens submitted to GC, chlamydia, wet prep.   (including critical care time) DIAGNOSTIC STUDIES: Oxygen Saturation is 98% on room air, normal by my interpretation.    COORDINATION OF CARE: 3:40PM EDP discusses pt ED treatment with pt  4:00PM EDP medication: 0.9% NaCl infusion, dilaudid 1 mg, Zofran 4 mg   5:23 PM Results for orders placed during the hospital encounter of 11/13/11  CBC WITH DIFFERENTIAL      Component Value Range   WBC 7.6  4.0 - 10.5 K/uL   RBC 4.56  3.87 - 5.11 MIL/uL   Hemoglobin 13.8  12.0 - 15.0 g/dL   HCT 16.1  09.6 - 04.5 %   MCV 88.8  78.0 - 100.0 fL   MCH 30.3  26.0 - 34.0 pg   MCHC 34.1  30.0 - 36.0 g/dL   RDW 40.9  81.1 - 91.4 %   Platelets 260  150 - 400 K/uL   Neutrophils Relative 50  43 - 77 %   Neutro Abs 3.8  1.7 - 7.7 K/uL   Lymphocytes Relative 41  12 - 46 %   Lymphs Abs 3.1  0.7 - 4.0 K/uL   Monocytes Relative 6  3 - 12 %   Monocytes Absolute 0.5  0.1 - 1.0 K/uL   Eosinophils Relative 3  0 - 5 %   Eosinophils Absolute 0.2  0.0 - 0.7 K/uL   Basophils Relative 0  0 - 1 %   Basophils Absolute 0.0  0.0 - 0.1 K/uL  COMPREHENSIVE METABOLIC PANEL      Component Value Range   Sodium 134 (*) 135 - 145 mEq/L   Potassium 3.5  3.5 - 5.1 mEq/L   Chloride 100  96 - 112 mEq/L   CO2 27  19 - 32 mEq/L   Glucose, Bld 89  70 - 99 mg/dL   BUN 7  6 - 23 mg/dL   Creatinine, Ser 7.82  0.50 - 1.10 mg/dL   Calcium 9.0  8.4 - 95.6 mg/dL   Total Protein 7.1  6.0 - 8.3 g/dL   Albumin 3.6  3.5 - 5.2 g/dL   AST 20  0 - 37 U/L   ALT 19  0 - 35 U/L   Alkaline Phosphatase 64  39 - 117 U/L   Total Bilirubin 0.3  0.3 - 1.2 mg/dL   GFR calc non Af Amer >90  >90 mL/min   GFR calc Af Amer >90  >90 mL/min   US Transvaginal Non-ob  11/13/2011   *RADIOLOGY REPORT*  Clinical Data: Right lower quadrant pain  TRANSABDOMINAL AND TRANSVAGINAL ULTRASOUND OF PELVIS Technique:  Both transabdominal and transvaginal ultrasound examinations of the pelvis were performed. Transabdominal technique was performed for global imaging of the pelvis including uterus, ovaries, adnexal regions, and pelvic cul-de-sac.  It was necessary to proceed with endovaginal exam following the transabdominal exam to visualize the  endometrium.  Comparison:  None  Findings:  Uterus: Normal in size and appearance, measuring 7.3 x 4.6 x 4.8 cm.  Endometrium: Normal in thickness and appearance, measuring 10 mm.  Right ovary:  Normal appearance/no adnexal mass, measuring 2.9 x 2.3 x 2.0 cm, noting a 1.4 cm involuting corpus luteum on the right.  Left ovary: Normal appearance/no adnexal mass, measuring 1.8 x 1.6 x 3.0 cm.  Other findings: Trace free fluid.  IMPRESSION: Normal study. No evidence of pelvic mass or other significant abnormality.  1.4 cm involuting corpus luteal cyst on the right.  Original Report Authenticated By: Charline Bills, M.D.   US Pelvis Complete  11/13/2011  *RADIOLOGY REPORT*  Clinical Data: Right lower quadrant pain  TRANSABDOMINAL AND TRANSVAGINAL ULTRASOUND OF PELVIS Technique:  Both transabdominal and transvaginal ultrasound examinations of the pelvis were performed. Transabdominal technique was performed for global imaging of the pelvis including uterus, ovaries, adnexal regions, and pelvic cul-de-sac.  It was necessary to proceed with endovaginal exam following the transabdominal exam to visualize the endometrium.  Comparison:  None  Findings:  Uterus: Normal in size and appearance, measuring 7.3 x 4.6 x 4.8 cm.  Endometrium: Normal in thickness and appearance, measuring 10 mm.  Right ovary:  Normal appearance/no adnexal mass, measuring 2.9 x 2.3 x 2.0 cm, noting a 1.4 cm involuting corpus luteum on the right.  Left ovary: Normal appearance/no adnexal mass,  measuring 1.8 x 1.6 x 3.0 cm.  Other findings: Trace free fluid.  IMPRESSION: Normal study. No evidence of pelvic mass or other significant abnormality.  1.4 cm involuting corpus luteal cyst on the right.  Original Report Authenticated By: Charline Bills, M.D.    Ultrasound shows involuting right ovarian cyst. She is still complaining of pain.  Will remedicate, and then perform her pelvic exam.  5:48 PM Pelvic exam performed.  6:31 PM Wet prep shows clue cells.  Will treat her for bacterial vaginosis.     1. Ovarian cyst   2. Bacterial vaginosis      I personally performed the services described in this documentation, which was scribed in my presence. The recorded information has been reviewed and considered.  Adrienne Levy, M.D.     Carleene Cooper III, MD 11/13/11 [phone]

## 2011-11-13 NOTE — ED Notes (Signed)
MD at bedside. 

## 2011-11-13 NOTE — ED Notes (Signed)
Complain of pain in right lower quad since the first of July. Worse after period

## 2011-11-15 LAB — URINE CULTURE: Colony Count: 50000

## 2012-01-01 ENCOUNTER — Emergency Department (HOSPITAL_COMMUNITY)
Admission: EM | Admit: 2012-01-01 | Discharge: 2012-01-01 | Disposition: A | Discharge status: Home / Self Care | Payer: Self-pay | Attending: Emergency Medicine | Admitting: Emergency Medicine

## 2012-01-01 ENCOUNTER — Encounter (HOSPITAL_COMMUNITY): Payer: Self-pay | Admitting: *Deleted

## 2012-01-01 DIAGNOSIS — R21 Rash and other nonspecific skin eruption: Secondary | ICD-10-CM | POA: Insufficient documentation

## 2012-01-01 DIAGNOSIS — F17200 Nicotine dependence, unspecified, uncomplicated: Secondary | ICD-10-CM | POA: Insufficient documentation

## 2012-01-01 DIAGNOSIS — T7840XA Allergy, unspecified, initial encounter: Secondary | ICD-10-CM

## 2012-01-01 DIAGNOSIS — H5789 Other specified disorders of eye and adnexa: Secondary | ICD-10-CM | POA: Insufficient documentation

## 2012-01-01 MED ORDER — DEXAMETHASONE SODIUM PHOSPHATE 4 MG/ML IJ SOLN
10.0000 mg | Freq: Once | INTRAMUSCULAR | Status: AC
  Administered 2012-01-01: 10 mg via INTRAVENOUS
  Filled 2012-01-01: qty 3

## 2012-01-01 MED ORDER — OXYCODONE-ACETAMINOPHEN 5-325 MG PO TABS
1.0000 | ORAL_TABLET | Freq: Once | ORAL | Status: AC
  Administered 2012-01-01: 1 via ORAL
  Filled 2012-01-01: qty 1

## 2012-01-01 MED ORDER — PREDNISONE 50 MG PO TABS: ORAL_TABLET | ORAL | Status: AC

## 2012-01-01 MED ORDER — EPINEPHRINE 0.3 MG/0.3ML IJ DEVI: 0.3000 mg | INTRAMUSCULAR | Status: DC | PRN

## 2012-01-01 MED ORDER — DIPHENHYDRAMINE HCL 50 MG/ML IJ SOLN
50.0000 mg | Freq: Once | INTRAMUSCULAR | Status: AC
  Administered 2012-01-01: 50 mg via INTRAMUSCULAR
  Filled 2012-01-01: qty 1

## 2012-01-01 NOTE — ED Notes (Signed)
C/o rash to body area, swelling to facial area, trouble swallowing, pt states that the rash started last night, swelling today.

## 2012-01-01 NOTE — ED Notes (Addendum)
Pt c/o of rash full body, difficulty swallowing but breathing is regular, aching all over body with headache. Lungs sounds clear bilaterally. Eyes are swollen and pt states that right side of lips feel tight.

## 2012-01-01 NOTE — ED Notes (Signed)
Pt states that she took two tablets of benadryl at 9am.

## 2012-01-01 NOTE — ED Provider Notes (Addendum)
History  This chart was scribed for Joya Gaskins, MD by Bennett Scrape. This patient was seen in room APAH6/APAH6 and the patient's care was started at 5:03PM.  CSN: [id number]  Arrival date & time 01/01/12  1305   First MD Initiated Contact with Patient 01/01/12 1703      Chief Complaint  Patient presents with  . Facial Swelling  . Rash    Patient is a 31 y.o. female presenting with rash. The history is provided by the patient. No language interpreter was used.  Rash  This is a new problem. The current episode started 6 to 12 hours ago. The problem has not changed since onset.The problem is associated with nothing. There has been no fever. The rash is present on the abdomen, left upper leg, left lower leg, right upper leg and right lower leg. She has tried antihistamines for the symptoms. The treatment provided no relief.    Adrienne Levy is a 31 y.o. female who presents to the Emergency Department complaining of approximately 9 hours of gradual onset, gradually worsening, constant bilateral facial swelling with associated HA and rash on lower extremities and abdomen that she woke up with. She reports that she took Benadryl about 7 hours ago with no improvement in her symptoms. She denies any new medications, foods or personal care products. She denies any recent insect or tick bites. She denies having prior episodes of similar symptoms. She denies trouble swallowing, trouble breathing, drooling, involvement of the mouth or throat, diarrhea, SOB, and syncope as associated symptoms. She does not have a h/o chronic medical conditions. She is a current everyday smoker but denies alcohol use.  Past Medical History  Diagnosis Date  . Ovarian cyst     Past Surgical History  Procedure Date  . Tubal ligation     No family history on file.  History  Substance Use Topics  . Smoking status: Current Everyday Smoker -- 1.0 packs/day  . Smokeless tobacco: Not on file  . Alcohol Use:  No    No OB history provided.  Review of Systems  Constitutional: Negative for fever and chills.  HENT: Positive for facial swelling. Negative for trouble swallowing.   Respiratory: Negative for shortness of breath.   Skin: Positive for rash.  Neurological: Positive for headaches. Negative for syncope.    Allergies  Ketorolac tromethamine and Tramadol  Home Medications   Current Outpatient Rx  Name Route Sig Dispense Refill  . ACETAMINOPHEN 325 MG PO TABS Oral Take 650 mg by mouth as needed. For pain    . DIPHENHYDRAMINE HCL 25 MG PO TABS Oral Take 50 mg by mouth once as needed. For allergic reaction      Triage Vitals: BP 110/70  Pulse 87  Temp 97.6 F (36.4 C) (Oral)  Resp 20  Ht 5\' 5"  (1.651 m)  Wt 165 lb (74.844 kg)  BMI 27.46 kg/m2  SpO2 99%  LMP 01/01/2012  Physical Exam  Nursing note and vitals reviewed.   CONSTITUTIONAL: Well developed/well nourished HEAD AND FACE: Normocephalic/atraumatic, mild periorbital edema right greater than left EYES: EOMI/PERRL ENMT: Mucous membranes moist, no lip or tongue swelling, no stridor and no drooling Voice normal NECK: supple no meningeal signs SPINE:entire spine nontender CV: S1/S2 noted, no murmurs/rubs/gallops noted LUNGS: Lungs are clear to auscultation bilaterally, no apparent distress ABDOMEN: soft, nontender, no rebound or guarding GU:no cva tenderness NEURO: Pt is awake/alert, moves all extremitiesx4 EXTREMITIES: pulses normal, full ROM SKIN: warm, color normal, urticaria to  extremities and abdomen PSYCH: no abnormalities of mood noted   ED Course  Procedures   DIAGNOSTIC STUDIES: Oxygen Saturation is 99% on room air, normal by my interpretation.    COORDINATION OF CARE: 5:16PM-Discussed treatment plan which includes Benadryl, steroids and observation with pt at bedside and pt agreed to plan. Discussed discharge plan of Benadryl, steroids and epinephrine pen with pt and she agreed.  6:09PM-Pt  rechecked and reports that her symptoms have improved. Upon re-exam, the periorbital swelling and rash have improved. Reiterated my discharge instructions from before and gave the pt instructions on how to use the epi pen. She is comfortable being discharged home.    MDM  Nursing notes including past medical history and social history reviewed and considered in documentation   I personally performed the services described in this documentation, which was scribed in my presence. The recorded information has been reviewed and considered.          Joya Gaskins, MD 01/01/12 9811  Joya Gaskins, MD 01/01/12 [phone]

## 2012-01-01 NOTE — ED Notes (Signed)
Throat - pink, no swelling noted

## 2012-02-08 ENCOUNTER — Encounter (HOSPITAL_COMMUNITY): Payer: Self-pay | Admitting: *Deleted

## 2012-02-08 ENCOUNTER — Emergency Department (HOSPITAL_COMMUNITY)
Admission: EM | Admit: 2012-02-08 | Discharge: 2012-02-08 | Disposition: A | Discharge status: Home / Self Care | Payer: Self-pay | Attending: Emergency Medicine | Admitting: Emergency Medicine

## 2012-02-08 DIAGNOSIS — R109 Unspecified abdominal pain: Secondary | ICD-10-CM | POA: Insufficient documentation

## 2012-02-08 DIAGNOSIS — F1193 Opioid use, unspecified with withdrawal: Secondary | ICD-10-CM

## 2012-02-08 DIAGNOSIS — R51 Headache: Secondary | ICD-10-CM | POA: Insufficient documentation

## 2012-02-08 DIAGNOSIS — R000 Tachycardia, unspecified: Secondary | ICD-10-CM | POA: Insufficient documentation

## 2012-02-08 DIAGNOSIS — F1120 Opioid dependence, uncomplicated: Secondary | ICD-10-CM

## 2012-02-08 DIAGNOSIS — F1123 Opioid dependence with withdrawal: Secondary | ICD-10-CM

## 2012-02-08 DIAGNOSIS — F19939 Other psychoactive substance use, unspecified with withdrawal, unspecified: Secondary | ICD-10-CM | POA: Insufficient documentation

## 2012-02-08 DIAGNOSIS — R112 Nausea with vomiting, unspecified: Secondary | ICD-10-CM | POA: Insufficient documentation

## 2012-02-08 LAB — COMPREHENSIVE METABOLIC PANEL
ALT: 20 U/L (ref 0–35)
Alkaline Phosphatase: 81 U/L (ref 39–117)
CO2: 28 mEq/L (ref 19–32)
Chloride: 102 mEq/L (ref 96–112)
GFR calc Af Amer: >90 mL/min (ref >90)
GFR calc non Af Amer: >90 mL/min (ref >90)
Glucose, Bld: 118 mg/dL — ABNORMAL HIGH (ref 70–99)
Potassium: 3.9 mEq/L (ref 3.5–5.1)
Sodium: 139 mEq/L (ref 135–145)
Total Bilirubin: 0.3 mg/dL (ref 0.3–1.2)

## 2012-02-08 LAB — RAPID URINE DRUG SCREEN, HOSP PERFORMED
Barbiturates: NOT DETECTED
Benzodiazepines: NOT DETECTED

## 2012-02-08 LAB — URINALYSIS, ROUTINE W REFLEX MICROSCOPIC
Ketones, ur: NEGATIVE mg/dL
Nitrite: NEGATIVE
Specific Gravity, Urine: 1.01 (ref 1.005–1.030)
pH: 7 (ref 5.0–8.0)

## 2012-02-08 LAB — POCT PREGNANCY, URINE: Preg Test, Ur: NEGATIVE

## 2012-02-08 LAB — URINE MICROSCOPIC-ADD ON

## 2012-02-08 LAB — CBC WITH DIFFERENTIAL/PLATELET
Eosinophils Relative: 1 % (ref 0–5)
HCT: 44 % (ref 36.0–46.0)
Lymphocytes Relative: 29 % (ref 12–46)
Lymphs Abs: 2.9 10*3/uL (ref 0.7–4.0)
MCV: 90.9 fL (ref 78.0–100.0)
Monocytes Absolute: 0.5 10*3/uL (ref 0.1–1.0)
Platelets: 311 10*3/uL (ref 150–400)
RBC: 4.84 MIL/uL (ref 3.87–5.11)
WBC: 10 10*3/uL (ref 4.0–10.5)

## 2012-02-08 MED ORDER — ALUM & MAG HYDROXIDE-SIMETH 200-200-20 MG/5ML PO SUSP: 30.0000 mL | ORAL | Status: DC | PRN

## 2012-02-08 MED ORDER — LORAZEPAM 1 MG PO TABS
1.0000 mg | ORAL_TABLET | Freq: Three times a day (TID) | ORAL | Status: DC | PRN
  Administered 2012-02-08: 1 mg via ORAL
  Filled 2012-02-08: qty 1

## 2012-02-08 MED ORDER — ACETAMINOPHEN 325 MG PO TABS
650.0000 mg | ORAL_TABLET | ORAL | Status: DC | PRN
  Administered 2012-02-08: 650 mg via ORAL
  Filled 2012-02-08: qty 2

## 2012-02-08 MED ORDER — IBUPROFEN 400 MG PO TABS: 600.0000 mg | ORAL_TABLET | Freq: Three times a day (TID) | ORAL | Status: DC | PRN

## 2012-02-08 MED ORDER — ONDANSETRON HCL 4 MG PO TABS: 4.0000 mg | ORAL_TABLET | Freq: Three times a day (TID) | ORAL | Status: DC | PRN

## 2012-02-08 MED ORDER — LOPERAMIDE HCL 2 MG PO CAPS: 2.0000 mg | ORAL_CAPSULE | ORAL | Status: DC | PRN

## 2012-02-08 MED ORDER — METHOCARBAMOL 500 MG PO TABS: 500.0000 mg | ORAL_TABLET | Freq: Three times a day (TID) | ORAL | Status: DC | PRN

## 2012-02-08 MED ORDER — DICYCLOMINE HCL 10 MG PO CAPS: 20.0000 mg | ORAL_CAPSULE | Freq: Four times a day (QID) | ORAL | Status: DC | PRN

## 2012-02-08 MED ORDER — ZOLPIDEM TARTRATE 5 MG PO TABS: 10.0000 mg | ORAL_TABLET | Freq: Every evening | ORAL | Status: DC | PRN

## 2012-02-08 MED ORDER — ONDANSETRON 4 MG PO TBDP: 4.0000 mg | ORAL_TABLET | Freq: Four times a day (QID) | ORAL | Status: DC | PRN

## 2012-02-08 MED ORDER — NAPROXEN 250 MG PO TABS: 500.0000 mg | ORAL_TABLET | Freq: Two times a day (BID) | ORAL | Status: DC | PRN

## 2012-02-08 MED ORDER — HYDROXYZINE HCL 25 MG PO TABS
25.0000 mg | ORAL_TABLET | Freq: Four times a day (QID) | ORAL | Status: DC | PRN
  Administered 2012-02-08: 25 mg via ORAL
  Filled 2012-02-08: qty 1

## 2012-02-08 MED ORDER — NICOTINE 21 MG/24HR TD PT24
21.0000 mg | MEDICATED_PATCH | Freq: Every day | TRANSDERMAL | Status: DC
  Administered 2012-02-08: 21 mg via TRANSDERMAL
  Filled 2012-02-08: qty 1

## 2012-02-08 NOTE — ED Notes (Signed)
Pt stated she was not hungry and her dinner tray was given to her mother at this time. Adrienne Levy

## 2012-02-08 NOTE — Progress Notes (Signed)
PT ACCEPTED TO [facility] BY [facility]. CALLED [facility] AND GOT AUTHORIZATION NO. FROM ANGELA [id number] 3 DAYS REVIEW ON 02/10/12.  PT'S MOTHER WILL TRANSPORT.

## 2012-02-08 NOTE — ED Notes (Signed)
Says she wants detox from pain meds.  Alert, vomiting, headache,  "muscles jumping and skin crawling"

## 2012-02-08 NOTE — BH Assessment (Signed)
Assessment Note   Adrienne Levy is an 31 y.o. female. PT PRESENTS TO THE ED SEEKING DETOX FROM OPIATES.  PT REPORTS SHE STARTED USING PAIN PILLS 2 1/2 YEARS AGO AND HAD DETOX AFTER ABOUT 6 MOS USE AND REMAINED SOBER FOR 29 DAYS AND SINCE MAY 2012 SHE HAS BEEN USING ABOUT 4 PILLS PER DAY OF OXYCONTIN OR ROXICET OR PERCOCET OR MORPHINE.  SHE LAST USED YESTERDAY AND IS HAVING WITHDRAWALS.  SHE DENIES S/I, H/I AND IS NOT PSYCHOTIC. PT DENIES MEDICAL PROBLEMS AND IS NOT ON ANY MEDICATIONS.  Axis I: OPIATE DEPENDENCY Axis II: Deferred Axis III:  Past Medical History  Diagnosis Date  . Ovarian cyst    Axis IV: problems related to social environment Axis V: 41-50 serious symptoms        Past Medical History:  Past Medical History  Diagnosis Date  . Ovarian cyst     Past Surgical History  Procedure Date  . Tubal ligation     Family History:  Family History  Problem Relation Age of Onset  . Rashes / Skin problems Mother     Social History:  reports that she has been smoking.  She does not have any smokeless tobacco history on file. She reports that she does not drink alcohol or use illicit drugs.  Additional Social History:  Alcohol / Drug Use Pain Medications: YES Prescriptions: NA Over the Counter: NA History of alcohol / drug use?: Yes Substance #1 Name of Substance 1: OPIATES 1 - Age of First Use: 27 1 - Amount (size/oz): 4 PILLS  1 - Frequency: DAILY 1 - Duration: 16 MONTHS  CIWA: CIWA-Ar BP: 129/92 mmHg Pulse Rate: 92  COWS: Clinical Opiate Withdrawal Scale (COWS) Resting Pulse Rate: Pulse Rate 81-100 Sweating: Subjective report of chills or flushing Restlessness: Frequent shifting or extraneous movements of legs/arms Pupil Size: Pupils possibly larger than normal for room light Bone or Joint Aches: Mild diffuse discomfort Runny Nose or Tearing: Not present GI Upset: nausea or loose stool Tremor: Slight tremor observable Yawning: No yawning Anxiety or  Irritability: Patient obviously irritable/anxious Gooseflesh Skin: Piloerection of skin can be felt or hairs standing up on arms COWS Total Score: 16   Allergies:  Allergies  Allergen Reactions  . Ketorolac Tromethamine Shortness Of Breath  . Tramadol Hives    Home Medications:  (Not in a hospital admission)  OB/GYN Status:  Patient's last menstrual period was 01/20/2012.  General Assessment Data Location of Assessment: [place] [facility] Assessment Services ACT Assessment: Yes Living Arrangements: Parent (Adrienne Levy) Can pt return to current living arrangement?: Yes Admission Status: Voluntary Is patient capable of signing voluntary admission?: Yes Transfer from: Acute Hospital [facility]) Referral Source: MD (DR IVA KNAPP)  Education Status Contact person: Adrienne Levy  Risk to self Suicidal Ideation: No Suicidal Intent: No Is patient at risk for suicide?: No Suicidal Plan?: No Access to Means: No What has been your use of drugs/alcohol within the last 12 months?: OPIATES Previous Attempts/Gestures: No How many times?: 0  Other Self Harm Risks: NA Triggers for Past Attempts: None known Intentional Self Injurious Behavior: None Family Suicide History: No Recent stressful life event(s): Other (Comment) (CAN'T STOP ON HER OWN) Persecutory voices/beliefs?: No Depression: No Substance abuse history and/or treatment for substance abuse?: No Suicide prevention information given to non-admitted patients: Not applicable  Risk to Others Homicidal Ideation: No Thoughts of Harm to Others: No Current Homicidal Intent: No Current Homicidal Plan: No Access to Homicidal Means: No  History of harm to others?: No Assessment of Violence: None Noted Violent Behavior Description: NA Does patient have access to weapons?: No Criminal Charges Pending?: No Does patient have a court date: No  Psychosis Hallucinations: None noted Delusions: None  noted  Mental Status Report Appear/Hygiene: Improved Eye Contact: Good Motor Activity: Freedom of movement Speech: Logical/coherent Level of Consciousness: Alert Mood: Irritable Affect: Appropriate to circumstance Anxiety Level: Moderate Thought Processes: Coherent;Relevant Judgement: Unimpaired Orientation: Person;Situation;Place;Time Obsessive Compulsive Thoughts/Behaviors: None  Cognitive Functioning Concentration: Normal Memory: Recent Intact;Remote Intact IQ: Average Insight: Fair Impulse Control: Fair Appetite: Fair Sleep: Decreased Total Hours of Sleep: 0  (X 2 NIGHTS) Vegetative Symptoms: None  ADLScreening [facility] Services) Patient's cognitive ability adequate to safely complete daily activities?: Yes Patient able to express need for assistance with ADLs?: Yes Independently performs ADLs?: Yes (appropriate for developmental age)  Abuse/Neglect [facility]) Physical Abuse: Denies Verbal Abuse: Denies Sexual Abuse: Denies  Prior Inpatient Therapy Prior Inpatient Therapy: Yes Prior Therapy Dates: MARCH 2012 Prior Therapy Facilty/Provider(s): [facility] Reason for Treatment: DETOX  Prior Outpatient Therapy Prior Outpatient Therapy: No  ADL Screening (condition at time of admission) Patient's cognitive ability adequate to safely complete daily activities?: Yes Patient able to express need for assistance with ADLs?: Yes Independently performs ADLs?: Yes (appropriate for developmental age) Weakness of Legs: None Weakness of Arms/Hands: None  Home Assistive Devices/Equipment Home Assistive Devices/Equipment: None  Therapy Consults (therapy consults require a physician order) PT Evaluation Needed: No OT Evalulation Needed: No SLP Evaluation Needed: No Abuse/Neglect Assessment (Assessment to be complete while patient is alone) Physical Abuse: Denies Verbal Abuse: Denies Sexual Abuse: Denies Exploitation of patient/patient's resources: Denies Self-Neglect:  Denies Values / Beliefs Cultural Requests During Hospitalization: None Spiritual Requests During Hospitalization: None Consults Spiritual Care Consult Needed: No Social Work Consult Needed: No Merchant navy officer (For Healthcare) Advance Directive: Patient does not have advance directive;Patient would not like information Pre-existing out of facility DNR order (yellow form or pink MOST form): No    Additional Information 1:1 In Past 12 Months?: No CIRT Risk: No Elopement Risk: No Does patient have medical clearance?: Yes     Disposition: REFERRED TO [facility] Disposition Disposition of Patient: Referred to Patient referred to: [facility]  On Site Evaluation by:   Reviewed with Physician:  DR IVA Clarene Duke Winford 02/08/2012 9:00 PM

## 2012-02-08 NOTE — ED Provider Notes (Cosign Needed)
History  This chart was scribed for Ward Givens, MD by Erskine Emery. This patient was seen in room APA17/APA17 and the patient's care was started at 16:28.   CSN: [id number]  Arrival date & time 02/08/12  1339   First MD Initiated Contact with Patient 02/08/12 1628      No chief complaint on file.   (Consider location/radiation/quality/duration/timing/severity/associated sxs/prior treatment) The history is provided by the patient. No language interpreter was used.  Adrienne Levy is a 31 y.o. female who presents to the Emergency Department complaining of voluntary withdrawal from pain medicine for the past 2 days. Pt reports some assocated abdominal cramps, nausea, emesis, skin crawling sensatins, and headaches but denies any associated depression, suicidal ideation, chest pain, or SOB.  Pt reports she is sick of the chase, she has been abusing pain pills for several years, since her late 11s. Pt typically took whatever she could get her hands on, for example: a couple roxicodone (30mg ), oxy, and morphine opana. Pt reports she got the pills from friends and would snort them or inject them into her right arm. Pt was in detox about a year and a half ago at [facility]; she was sober for about 29 days but did not got to Narcotics Anonymous and relapsed when she started hanging around with old drug buddies.  She denies depression, SI or HI. She denies having criminal charges.   Past Medical History  Diagnosis Date  . Ovarian cyst     Past Surgical History  Procedure Date  . Tubal ligation     Family History  Problem Relation Age of Onset  . Rashes / Skin problems Mother     History  Substance Use Topics  . Smoking status: Current Every Day Smoker -- 1.0 packs/day  . Smokeless tobacco: Not on file  . Alcohol Use: No   Pt reports she smokes about a pack of cigarettes/day. Pt denies drinking. Pt does not work, she lives with her mother and sister (who also abuses pain medication).  OB  History    Grav Para Term Preterm Abortions TAB SAB Ect Mult Living                  Review of Systems  Constitutional: Negative for fever and chills.  HENT: Negative for congestion.   Eyes: Negative for discharge.  Respiratory: Negative for shortness of breath.   Cardiovascular: Negative for chest pain.  Gastrointestinal: Positive for nausea, vomiting and abdominal pain.  Genitourinary: Negative for hematuria.  Skin: Negative for rash.  Neurological: Positive for headaches. Negative for weakness.  Psychiatric/Behavioral: Negative for suicidal ideas.  All other systems reviewed and are negative.    Allergies  Ketorolac tromethamine and Tramadol  Home Medications   Current Outpatient Rx  Name Route Sig Dispense Refill  . ACETAMINOPHEN 325 MG PO TABS Oral Take 650 mg by mouth as needed. For pain    . DIPHENHYDRAMINE HCL 25 MG PO TABS Oral Take 50 mg by mouth once as needed. For allergic reaction    . EPINEPHRINE 0.3 MG/0.3ML IJ DEVI Intramuscular Inject 0.3 mLs (0.3 mg total) into the muscle as needed. 2 Device 1    Triage Vitals: BP 144/85  Pulse 85  Temp 98.5 F (36.9 C) (Oral)  Resp 18  Ht 5\' 5"  (1.651 m)  Wt 165 lb (74.844 kg)  BMI 27.46 kg/m2  SpO2 98%  LMP 01/20/2012  Vital signs normal    Physical Exam  Nursing note and vitals  reviewed. Constitutional: She is oriented to person, place, and time. She appears well-developed and well-nourished.  Non-toxic appearance. She does not appear ill. No distress.  HENT:  Head: Normocephalic and atraumatic.  Right Ear: External ear normal.  Left Ear: External ear normal.  Nose: Nose normal. No mucosal edema or rhinorrhea.  Mouth/Throat: Mucous membranes are normal. No dental abscesses or uvula swelling.       Tongue is dry  Eyes: Conjunctivae normal and EOM are normal. Pupils are equal, round, and reactive to light.  Neck: Normal range of motion and full passive range of motion without pain. Neck supple.    Cardiovascular: Regular rhythm and normal heart sounds.  Tachycardia present.  Exam reveals no gallop and no friction rub.   No murmur heard. Pulmonary/Chest: Effort normal and breath sounds normal. No respiratory distress. She has no wheezes. She has no rhonchi. She has no rales. She exhibits no tenderness and no crepitus.  Abdominal: Soft. Normal appearance and bowel sounds are normal. She exhibits no distension. There is no tenderness. There is no rebound and no guarding.  Musculoskeletal: Normal range of motion. She exhibits no edema and no tenderness.       Moves all extremities well.   Neurological: She is alert and oriented to person, place, and time. She has normal strength. No cranial nerve deficit.  Skin: Skin is warm, dry and intact. No rash noted. No erythema. No pallor.       Has fresh track marks in her right antecubital and  Volar wrist  Psychiatric: She has a normal mood and affect. Her speech is normal and behavior is normal. Her mood appears not anxious.       Jittery, has trouble holding still.    ED Course  Procedures (including critical care time) DIAGNOSTIC STUDIES: Oxygen Saturation is 98% on room air, normal by my interpretation.    COORDINATION OF CARE: 17:13--I evaluated the patient and we discussed a treatment plan including psychiatric consult to which the pt agreed.   17:40--Consulted with Samson Frederic (ACT).  19:09--I rechecked the pt who has been given ativan but has not yet had all of the medications ordered.   22:00 Ella, ACT, states accepted at [facility], [facility] can transport   Results for orders placed during the hospital encounter of 02/08/12  CBC WITH DIFFERENTIAL      Component Value Range   WBC 10.0  4.0 - 10.5 K/uL   RBC 4.84  3.87 - 5.11 MIL/uL   Hemoglobin 15.0  12.0 - 15.0 g/dL   HCT 16.1  09.6 - 04.5 %   MCV 90.9  78.0 - 100.0 fL   MCH 31.0  26.0 - 34.0 pg   MCHC 34.1  30.0 - 36.0 g/dL   RDW 40.9  81.1 - 91.4 %   Platelets 311  150 - 400 K/uL    Neutrophils Relative 65  43 - 77 %   Neutro Abs 6.5  1.7 - 7.7 K/uL   Lymphocytes Relative 29  12 - 46 %   Lymphs Abs 2.9  0.7 - 4.0 K/uL   Monocytes Relative 5  3 - 12 %   Monocytes Absolute 0.5  0.1 - 1.0 K/uL   Eosinophils Relative 1  0 - 5 %   Eosinophils Absolute 0.1  0.0 - 0.7 K/uL   Basophils Relative 0  0 - 1 %   Basophils Absolute 0.0  0.0 - 0.1 K/uL  COMPREHENSIVE METABOLIC PANEL      Component Value  Range   Sodium 139  135 - 145 mEq/L   Potassium 3.9  3.5 - 5.1 mEq/L   Chloride 102  96 - 112 mEq/L   CO2 28  19 - 32 mEq/L   Glucose, Bld 118 (*) 70 - 99 mg/dL   BUN 5 (*) 6 - 23 mg/dL   Creatinine, Ser 1.61  0.50 - 1.10 mg/dL   Calcium 09.6  8.4 - 04.5 mg/dL   Total Protein 7.6  6.0 - 8.3 g/dL   Albumin 3.7  3.5 - 5.2 g/dL   AST 21  0 - 37 U/L   ALT 20  0 - 35 U/L   Alkaline Phosphatase 81  39 - 117 U/L   Total Bilirubin 0.3  0.3 - 1.2 mg/dL   GFR calc non Af Amer >90  >90 mL/min   GFR calc Af Amer >90  >90 mL/min  ETHANOL      Component Value Range   Alcohol, Ethyl (B) <11  0 - 11 mg/dL  URINE RAPID DRUG SCREEN (HOSP PERFORMED)      Component Value Range   Opiates NONE DETECTED  NONE DETECTED   Cocaine NONE DETECTED  NONE DETECTED   Benzodiazepines NONE DETECTED  NONE DETECTED   Amphetamines NONE DETECTED  NONE DETECTED   Tetrahydrocannabinol NONE DETECTED  NONE DETECTED   Barbiturates NONE DETECTED  NONE DETECTED  URINALYSIS, ROUTINE W REFLEX MICROSCOPIC      Component Value Range   Color, Urine YELLOW  YELLOW   APPearance HAZY (*) CLEAR   Specific Gravity, Urine 1.010  1.005 - 1.030   pH 7.0  5.0 - 8.0   Glucose, UA NEGATIVE  NEGATIVE mg/dL   Hgb urine dipstick NEGATIVE  NEGATIVE   Bilirubin Urine NEGATIVE  NEGATIVE   Ketones, ur NEGATIVE  NEGATIVE mg/dL   Protein, ur NEGATIVE  NEGATIVE mg/dL   Urobilinogen, UA 0.2  0.0 - 1.0 mg/dL   Nitrite NEGATIVE  NEGATIVE   Leukocytes, UA TRACE (*) NEGATIVE  POCT PREGNANCY, URINE      Component Value Range   Preg  Test, Ur NEGATIVE  NEGATIVE  URINE MICROSCOPIC-ADD ON      Component Value Range   Squamous Epithelial / LPF MANY (*) RARE   WBC, UA 3-6  <3 WBC/hpf   RBC / HPF 0-2  <3 RBC/hpf   Bacteria, UA FEW (*) RARE   Laboratory interpretation all normal       1. Narcotic addiction   2. Narcotic withdrawal    Plan discharge to go to [facility]  Devoria Albe, MD, FACEP    MDM   I personally performed the services described in this documentation, which was scribed in my presence. The recorded information has been reviewed and considered.  Devoria Albe, MD, Armando Gang    Ward Givens, MD 02/08/12 2203

## 2012-12-21 ENCOUNTER — Emergency Department (HOSPITAL_COMMUNITY)
Admission: EM | Admit: 2012-12-21 | Discharge: 2012-12-22 | Disposition: A | Discharge status: Home / Self Care | Payer: Self-pay | Attending: Emergency Medicine | Admitting: Emergency Medicine

## 2012-12-21 ENCOUNTER — Emergency Department (HOSPITAL_COMMUNITY): Payer: Self-pay

## 2012-12-21 ENCOUNTER — Encounter (HOSPITAL_COMMUNITY): Payer: Self-pay

## 2012-12-21 DIAGNOSIS — N949 Unspecified condition associated with female genital organs and menstrual cycle: Secondary | ICD-10-CM | POA: Insufficient documentation

## 2012-12-21 DIAGNOSIS — Z9851 Tubal ligation status: Secondary | ICD-10-CM | POA: Insufficient documentation

## 2012-12-21 DIAGNOSIS — R1930 Abdominal rigidity, unspecified site: Secondary | ICD-10-CM | POA: Insufficient documentation

## 2012-12-21 DIAGNOSIS — R109 Unspecified abdominal pain: Secondary | ICD-10-CM

## 2012-12-21 DIAGNOSIS — Z3202 Encounter for pregnancy test, result negative: Secondary | ICD-10-CM | POA: Insufficient documentation

## 2012-12-21 DIAGNOSIS — F17200 Nicotine dependence, unspecified, uncomplicated: Secondary | ICD-10-CM | POA: Insufficient documentation

## 2012-12-21 DIAGNOSIS — N898 Other specified noninflammatory disorders of vagina: Secondary | ICD-10-CM | POA: Insufficient documentation

## 2012-12-21 DIAGNOSIS — R1031 Right lower quadrant pain: Secondary | ICD-10-CM | POA: Insufficient documentation

## 2012-12-21 DIAGNOSIS — Z8742 Personal history of other diseases of the female genital tract: Secondary | ICD-10-CM | POA: Insufficient documentation

## 2012-12-21 DIAGNOSIS — R102 Pelvic and perineal pain: Secondary | ICD-10-CM

## 2012-12-21 DIAGNOSIS — R110 Nausea: Secondary | ICD-10-CM | POA: Insufficient documentation

## 2012-12-21 LAB — COMPREHENSIVE METABOLIC PANEL
BUN: 5 mg/dL — ABNORMAL LOW (ref 6–23)
CO2: 27 mEq/L (ref 19–32)
Calcium: 9.3 mg/dL (ref 8.4–10.5)
Creatinine, Ser: 0.68 mg/dL (ref 0.50–1.10)
GFR calc Af Amer: >90 mL/min (ref >90)
GFR calc non Af Amer: >90 mL/min (ref >90)
Glucose, Bld: 108 mg/dL — ABNORMAL HIGH (ref 70–99)

## 2012-12-21 LAB — CBC WITH DIFFERENTIAL/PLATELET
Eosinophils Absolute: 0.7 10*3/uL (ref 0.0–0.7)
Eosinophils Relative: 9 % — ABNORMAL HIGH (ref 0–5)
HCT: 42.3 % (ref 36.0–46.0)
Lymphocytes Relative: 35 % (ref 12–46)
Lymphs Abs: 2.6 10*3/uL (ref 0.7–4.0)
MCH: 29.7 pg (ref 26.0–34.0)
MCV: 87.9 fL (ref 78.0–100.0)
Monocytes Absolute: 0.5 10*3/uL (ref 0.1–1.0)
RBC: 4.81 MIL/uL (ref 3.87–5.11)
WBC: 7.3 10*3/uL (ref 4.0–10.5)

## 2012-12-21 LAB — POCT PREGNANCY, URINE: Preg Test, Ur: NEGATIVE

## 2012-12-21 LAB — URINALYSIS, ROUTINE W REFLEX MICROSCOPIC
Bilirubin Urine: NEGATIVE
Glucose, UA: NEGATIVE mg/dL
Hgb urine dipstick: NEGATIVE
Protein, ur: NEGATIVE mg/dL
Specific Gravity, Urine: 1.02 (ref 1.005–1.030)

## 2012-12-21 LAB — WET PREP, GENITAL: Yeast Wet Prep HPF POC: NONE SEEN

## 2012-12-21 MED ORDER — PROMETHAZINE HCL 25 MG PO TABS: 25.0000 mg | ORAL_TABLET | Freq: Four times a day (QID) | ORAL | Status: DC | PRN

## 2012-12-21 MED ORDER — SODIUM CHLORIDE 0.9 % IV SOLN
Freq: Once | INTRAVENOUS | Status: AC
  Administered 2012-12-21: 17:00:00 via INTRAVENOUS

## 2012-12-21 MED ORDER — HYDROMORPHONE HCL PF 1 MG/ML IJ SOLN
0.5000 mg | Freq: Once | INTRAMUSCULAR | Status: AC
  Administered 2012-12-21: 0.5 mg via INTRAVENOUS
  Filled 2012-12-21: qty 1

## 2012-12-21 MED ORDER — ONDANSETRON HCL 4 MG/2ML IJ SOLN
4.0000 mg | Freq: Once | INTRAMUSCULAR | Status: AC
Start: 2012-12-21 — End: 2012-12-21
  Administered 2012-12-21: 4 mg via INTRAVENOUS
  Filled 2012-12-21: qty 2

## 2012-12-21 MED ORDER — PROMETHAZINE HCL 25 MG/ML IJ SOLN
12.5000 mg | Freq: Once | INTRAMUSCULAR | Status: AC
  Administered 2012-12-21: 12.5 mg via INTRAVENOUS
  Filled 2012-12-21: qty 1

## 2012-12-21 MED ORDER — HYDROCODONE-ACETAMINOPHEN 5-325 MG PO TABS: 1.0000 | ORAL_TABLET | ORAL | Status: DC | PRN

## 2012-12-21 MED ORDER — IOHEXOL 300 MG/ML  SOLN
100.0000 mL | Freq: Once | INTRAMUSCULAR | Status: AC | PRN
  Administered 2012-12-21: 100 mL via INTRAVENOUS

## 2012-12-21 MED ORDER — HYDROMORPHONE HCL PF 1 MG/ML IJ SOLN
1.0000 mg | Freq: Once | INTRAMUSCULAR | Status: AC
  Administered 2012-12-21: 1 mg via INTRAVENOUS
  Filled 2012-12-21: qty 1

## 2012-12-21 MED ORDER — IOHEXOL 300 MG/ML  SOLN
50.0000 mL | Freq: Once | INTRAMUSCULAR | Status: AC | PRN
  Administered 2012-12-21: 50 mL via ORAL

## 2012-12-21 MED ORDER — ONDANSETRON 4 MG PO TBDP
ORAL_TABLET | ORAL | Status: AC
  Filled 2012-12-21: qty 1

## 2012-12-21 NOTE — ED Notes (Signed)
Pt just received contrast. nad

## 2012-12-21 NOTE — ED Notes (Signed)
Pt reports right lower quad ab pain for 2 days, pain is worse today, +nausea, no fever, +vaginal discharge -unsure of color. Denies any urinary s/s

## 2012-12-21 NOTE — ED Provider Notes (Signed)
CSN: [id number]     Arrival date & time 12/21/12  1624 History     First MD Initiated Contact with Patient 12/21/12 1641     Chief Complaint  Patient presents with  . Abdominal Pain   (Consider location/radiation/quality/duration/timing/severity/associated sxs/prior Treatment) Patient is a 32 y.o. female presenting with abdominal pain. The history is provided by the patient.  Abdominal Pain Pain location:  RLQ Pain quality: cramping, sharp and stabbing   Pain radiates to:  Does not radiate Pain severity:  Moderate (8/10) Onset quality:  Gradual Duration:  2 days Timing:  Constant Progression:  Worsening Chronicity:  New Context: not laxative use, not recent sexual activity and not sick contacts   Relieved by:  Nothing Worsened by:  Nothing tried Ineffective treatments: hydrocodone. Associated symptoms: nausea and vaginal discharge   Associated symptoms: no anorexia, no chills, no constipation, no cough, no fever, no vaginal bleeding and no vomiting   Risk factors: not pregnant    Adrienne Levy is a 32 y.o. female who presents to the ED with abdominal pain that started 2 days ago. Has had similar pain in the past but never this bad. Hx of ovarian cyst.  LMP 1 1/2 week ago. BTL for birth control. Not sexually active for past 9 months.   Past Medical History  Diagnosis Date  . Ovarian cyst    Past Surgical History  Procedure Laterality Date  . Tubal ligation     Family History  Problem Relation Age of Onset  . Rashes / Skin problems Mother    History  Substance Use Topics  . Smoking status: Current Every Day Smoker -- 1.00 packs/day    Types: Cigarettes  . Smokeless tobacco: Not on file  . Alcohol Use: No   OB History   Grav Para Term Preterm Abortions TAB SAB Ect Mult Living                 Review of Systems  Constitutional: Negative for fever and chills.  HENT: Negative for neck pain.   Respiratory: Negative for cough.   Gastrointestinal: Positive for  nausea and abdominal pain. Negative for vomiting, constipation and anorexia.  Genitourinary: Positive for vaginal discharge. Negative for vaginal bleeding.  Musculoskeletal: Negative for back pain.  Skin: Negative for rash.  Neurological: Negative for dizziness and headaches.  Psychiatric/Behavioral: The patient is not nervous/anxious.     Allergies  Ketorolac tromethamine and Tramadol  Home Medications   Current Outpatient Rx  Name  Route  Sig  Dispense  Refill  . acetaminophen (TYLENOL) 325 MG tablet   Oral   Take 650 mg by mouth as needed. For pain         . HYDROcodone-acetaminophen (NORCO/VICODIN) 5-325 MG per tablet   Oral   Take 1 tablet by mouth every 6 (six) hours as needed for pain.         Marland Kitchen EPINEPHrine (EPIPEN) 0.3 mg/0.3 mL DEVI   Intramuscular   Inject 0.3 mLs (0.3 mg total) into the muscle as needed.   2 Device   1    BP 113/87  Pulse 65  Temp(Src) 98.2 F (36.8 C) (Oral)  Resp 18  Ht 5\' 5"  (1.651 m)  Wt 170 lb (77.111 kg)  BMI 28.29 kg/m2  SpO2 97%  LMP 12/09/2012 Physical Exam  Nursing note and vitals reviewed. Constitutional: She is oriented to person, place, and time. She appears well-developed and well-nourished.  Eyes: EOM are normal.  Neck: Neck supple.  Cardiovascular:  Normal rate.   Pulmonary/Chest: Effort normal.  Abdominal: Soft. Bowel sounds are normal. There is tenderness in the right lower quadrant. There is guarding. There is no CVA tenderness.  Genitourinary:  External genitalia without lesions, positive CMT, right adnexal tenderness. Uterus without palpable enlargement.  Musculoskeletal: Normal range of motion.  Neurological: She is alert and oriented to person, place, and time. No cranial nerve deficit.  Skin: Skin is warm and dry.  Psychiatric: She has a normal mood and affect. Her behavior is normal.   Results for orders placed during the hospital encounter of 12/21/12 (from the past 24 hour(s))  CBC WITH DIFFERENTIAL      Status: Abnormal   Collection Time    12/21/12  4:46 PM      Result Value Range   WBC 7.3  4.0 - 10.5 K/uL   RBC 4.81  3.87 - 5.11 MIL/uL   Hemoglobin 14.3  12.0 - 15.0 g/dL   HCT 16.1  09.6 - 04.5 %   MCV 87.9  78.0 - 100.0 fL   MCH 29.7  26.0 - 34.0 pg   MCHC 33.8  30.0 - 36.0 g/dL   RDW 40.9  81.1 - 91.4 %   Platelets 287  150 - 400 K/uL   Neutrophils Relative % 49  43 - 77 %   Neutro Abs 3.6  1.7 - 7.7 K/uL   Lymphocytes Relative 35  12 - 46 %   Lymphs Abs 2.6  0.7 - 4.0 K/uL   Monocytes Relative 6  3 - 12 %   Monocytes Absolute 0.5  0.1 - 1.0 K/uL   Eosinophils Relative 9 (*) 0 - 5 %   Eosinophils Absolute 0.7  0.0 - 0.7 K/uL   Basophils Relative 1  0 - 1 %   Basophils Absolute 0.0  0.0 - 0.1 K/uL  COMPREHENSIVE METABOLIC PANEL     Status: Abnormal   Collection Time    12/21/12  4:46 PM      Result Value Range   Sodium 134 (*) 135 - 145 mEq/L   Potassium 3.9  3.5 - 5.1 mEq/L   Chloride 98  96 - 112 mEq/L   CO2 27  19 - 32 mEq/L   Glucose, Bld 108 (*) 70 - 99 mg/dL   BUN 5 (*) 6 - 23 mg/dL   Creatinine, Ser 7.82  0.50 - 1.10 mg/dL   Calcium 9.3  8.4 - 95.6 mg/dL   Total Protein 7.2  6.0 - 8.3 g/dL   Albumin 3.4 (*) 3.5 - 5.2 g/dL   AST 27  0 - 37 U/L   ALT 14  0 - 35 U/L   Alkaline Phosphatase 68  39 - 117 U/L   Total Bilirubin 0.3  0.3 - 1.2 mg/dL   GFR calc non Af Amer >90  >90 mL/min   GFR calc Af Amer >90  >90 mL/min  URINALYSIS, ROUTINE W REFLEX MICROSCOPIC     Status: None   Collection Time    12/21/12  4:59 PM      Result Value Range   Color, Urine YELLOW  YELLOW   APPearance CLEAR  CLEAR   Specific Gravity, Urine 1.020  1.005 - 1.030   pH 7.0  5.0 - 8.0   Glucose, UA NEGATIVE  NEGATIVE mg/dL   Hgb urine dipstick NEGATIVE  NEGATIVE   Bilirubin Urine NEGATIVE  NEGATIVE   Ketones, ur NEGATIVE  NEGATIVE mg/dL   Protein, ur NEGATIVE  NEGATIVE mg/dL  Urobilinogen, UA 0.2  0.0 - 1.0 mg/dL   Nitrite NEGATIVE  NEGATIVE   Leukocytes, UA NEGATIVE  NEGATIVE   WET PREP, GENITAL     Status: Abnormal   Collection Time    12/21/12  4:59 PM      Result Value Range   Yeast Wet Prep HPF POC NONE SEEN  NONE SEEN   Trich, Wet Prep NONE SEEN  NONE SEEN   Clue Cells Wet Prep HPF POC FEW (*) NONE SEEN   WBC, Wet Prep HPF POC NONE SEEN  NONE SEEN  POCT PREGNANCY, URINE     Status: None   Collection Time    12/21/12  5:03 PM      Result Value Range   Preg Test, Ur NEGATIVE  NEGATIVE    ED Course: Re evaluation after CT scan and patient crying and continues to be in severe pain.   US Transvaginal Non-ob  12/21/2012   *RADIOLOGY REPORT*  Clinical Data: Severe right-sided pelvic pain, history of tubal ligation  TRANSVAGINAL ULTRASOUND OF PELVIS DOPPLER ULTRASOUND OF OVARIES  Technique:  Transvaginal ultrasound examination of the pelvis was performed including evaluation of the uterus, ovaries, adnexal regions, and pelvic cul-de-sac. Color and duplex Doppler ultrasound was utilized to evaluate blood flow to the ovaries.  Comparison:  Pelvic ultrasound - 11/13/2011  Findings:  Uterus:  Normal appearance of the anteverted uterus.  The uterus is normal in size measuring 8.3 x 4.3 x 4.9 cm.  No discrete uterine mass.  Endometrium: Normal in thickness and appearance.  Endometrium measures approximately 6 mm in diameter.  Right ovary: Normal in size measuring 3.5 x 1.8 x 1.1 cm.  Note is made of an approximately 1.1 cm anechoic ovarian cyst.  Several additional sub centimeter follicles are noted within the right ovary.  Left ovary: Normal in size measuring 2.7 x 1.9 x 1.8 cm.  Several small peripheral sub centimeter follicles are noted within the left ovary.  Other Findings:  No free fluid  Pulsed Doppler evaluation demonstrates normal low-resistance arterial and venous waveforms in both ovaries.  IMPRESSION:  No explanation for patient's severe right-sided pelvic pain. Specifically, no evidence of ovarian mass or ovarian torsion.   Original Report Authenticated By: Tacey Ruiz, MD   US Pelvis Complete  12/21/2012   *RADIOLOGY REPORT*  Clinical Data: Severe right-sided pelvic pain, history of tubal ligation  TRANSVAGINAL ULTRASOUND OF PELVIS DOPPLER ULTRASOUND OF OVARIES  Technique:  Transvaginal ultrasound examination of the pelvis was performed including evaluation of the uterus, ovaries, adnexal regions, and pelvic cul-de-sac. Color and duplex Doppler ultrasound was utilized to evaluate blood flow to the ovaries.  Comparison:  Pelvic ultrasound - 11/13/2011  Findings:  Uterus:  Normal appearance of the anteverted uterus.  The uterus is normal in size measuring 8.3 x 4.3 x 4.9 cm.  No discrete uterine mass.  Endometrium: Normal in thickness and appearance.  Endometrium measures approximately 6 mm in diameter.  Right ovary: Normal in size measuring 3.5 x 1.8 x 1.1 cm.  Note is made of an approximately 1.1 cm anechoic ovarian cyst.  Several additional sub centimeter follicles are noted within the right ovary.  Left ovary: Normal in size measuring 2.7 x 1.9 x 1.8 cm.  Several small peripheral sub centimeter follicles are noted within the left ovary.  Other Findings:  No free fluid  Pulsed Doppler evaluation demonstrates normal low-resistance arterial and venous waveforms in both ovaries.  IMPRESSION:  No explanation for patient's severe right-sided  pelvic pain. Specifically, no evidence of ovarian mass or ovarian torsion.   Original Report Authenticated By: Tacey Ruiz, MD   Ct Abdomen Pelvis W Contrast  12/21/2012   *RADIOLOGY REPORT*  Clinical Data: Right lower quadrant abdominal pain.  CT ABDOMEN AND PELVIS WITH CONTRAST  Technique:  Multidetector CT imaging of the abdomen and pelvis was performed following the standard protocol during bolus administration of intravenous contrast.  Contrast: 50mL OMNIPAQUE IOHEXOL 300 MG/ML  SOLN, OMNIPAQUE IOHEXOL 300 MG/ML  SOLN  Comparison: 11/13/2011 ultrasound, 10/05/2011 CT  Findings: Limited images through the lung bases demonstrate  no significant appreciable abnormality. The heart size is within normal limits. No pleural or pericardial effusion.  Mild decreased hepatic attenuation, nonspecific post contrast however suggests steatosis.  Unchanged focal hypodensity adjacent the falciform ligament, favored to reflect focal fat or variant perfusion.  Unremarkable biliary system, spleen, pancreas, adrenal glands.  Symmetric renal enhancement.  No hydroureteronephrosis. Limited post contrast evaluation for stones however no overt ureteral calculi.  No colitis.  Normal appendix.  Small bowel loops are normal in course and caliber.  No free intraperitoneal air or fluid.  No lymphadenopathy.  Normal caliber aorta and branch vessels.  Thin-walled bladder.  Nonspecific evaluation of the uterus by CT, appears similar to prior.  No adnexal mass.  Unchanged mild L1 vertebral body height loss.  Superior endplate rounded defect at T11 has developed in the interval, favored to reflect a Schmorl's node.  IMPRESSION: Normal appendix.  No acute abdominopelvic process identified by CT.  Mild hepatic steatosis suggested.   Original Report Authenticated By: Jearld Lesch, M.D.   [place] Art/ven Flow Abd Pelv Doppler  12/21/2012   *RADIOLOGY REPORT*  Clinical Data: Severe right-sided pelvic pain, history of tubal ligation  TRANSVAGINAL ULTRASOUND OF PELVIS DOPPLER ULTRASOUND OF OVARIES  Technique:  Transvaginal ultrasound examination of the pelvis was performed including evaluation of the uterus, ovaries, adnexal regions, and pelvic cul-de-sac. Color and duplex Doppler ultrasound was utilized to evaluate blood flow to the ovaries.  Comparison:  Pelvic ultrasound - 11/13/2011  Findings:  Uterus:  Normal appearance of the anteverted uterus.  The uterus is normal in size measuring 8.3 x 4.3 x 4.9 cm.  No discrete uterine mass.  Endometrium: Normal in thickness and appearance.  Endometrium measures approximately 6 mm in diameter.  Right ovary: Normal in size measuring  3.5 x 1.8 x 1.1 cm.  Note is made of an approximately 1.1 cm anechoic ovarian cyst.  Several additional sub centimeter follicles are noted within the right ovary.  Left ovary: Normal in size measuring 2.7 x 1.9 x 1.8 cm.  Several small peripheral sub centimeter follicles are noted within the left ovary.  Other Findings:  No free fluid  Pulsed Doppler evaluation demonstrates normal low-resistance arterial and venous waveforms in both ovaries.  IMPRESSION:  No explanation for patient's severe right-sided pelvic pain. Specifically, no evidence of ovarian mass or ovarian torsion.   Original Report Authenticated By: Tacey Ruiz, MD    Procedures  Re evaluation @ 11:30 pm. Patient feeling some better, discussed all lab, ultrasound and CT results and need for follow up. Patient voices understanding.  MDM  32 y.o. female with pelvic/abdominal pain. All studies normal. Patient stable for discharge home without any immediate complications. Vital signs normal O2 Sat 100% R/A I have reviewed this patient's vital signs, nurses notes, appropriate labs and imaging.  I have discussed findings and plan of care with the patient. She plans follow  up with her GYN this week.    [facility] Orlene Och, [place] 12/22/12 [phone]

## 2012-12-22 LAB — GC/CHLAMYDIA PROBE AMP
CT Probe RNA: NEGATIVE
GC Probe RNA: NEGATIVE

## 2012-12-22 NOTE — ED Provider Notes (Signed)
Medical screening examination/treatment/procedure(s) were performed by non-physician practitioner and as supervising physician I was immediately available for consultation/collaboration.  Levone Otten R. Christen Bedoya, MD 12/22/12 0035 

## 2013-01-18 ENCOUNTER — Emergency Department (HOSPITAL_COMMUNITY)
Admission: EM | Admit: 2013-01-18 | Discharge: 2013-01-18 | Disposition: A | Discharge status: Home / Self Care | Payer: Self-pay | Attending: Emergency Medicine | Admitting: Emergency Medicine

## 2013-01-18 ENCOUNTER — Emergency Department (HOSPITAL_COMMUNITY): Payer: Self-pay

## 2013-01-18 ENCOUNTER — Encounter (HOSPITAL_COMMUNITY): Payer: Self-pay | Admitting: *Deleted

## 2013-01-18 DIAGNOSIS — R1031 Right lower quadrant pain: Secondary | ICD-10-CM | POA: Insufficient documentation

## 2013-01-18 DIAGNOSIS — N949 Unspecified condition associated with female genital organs and menstrual cycle: Secondary | ICD-10-CM | POA: Insufficient documentation

## 2013-01-18 DIAGNOSIS — F17200 Nicotine dependence, unspecified, uncomplicated: Secondary | ICD-10-CM | POA: Insufficient documentation

## 2013-01-18 DIAGNOSIS — Z3202 Encounter for pregnancy test, result negative: Secondary | ICD-10-CM | POA: Insufficient documentation

## 2013-01-18 DIAGNOSIS — R102 Pelvic and perineal pain: Secondary | ICD-10-CM

## 2013-01-18 LAB — CBC WITH DIFFERENTIAL/PLATELET
Eosinophils Relative: 2 % (ref 0–5)
Hemoglobin: 13.4 g/dL (ref 12.0–15.0)
Lymphocytes Relative: 5 % — ABNORMAL LOW (ref 12–46)
Lymphs Abs: 0.6 10*3/uL — ABNORMAL LOW (ref 0.7–4.0)
MCV: 87.1 fL (ref 78.0–100.0)
Platelets: 242 10*3/uL (ref 150–400)
RBC: 4.56 MIL/uL (ref 3.87–5.11)
WBC: 12 10*3/uL — ABNORMAL HIGH (ref 4.0–10.5)

## 2013-01-18 LAB — COMPREHENSIVE METABOLIC PANEL
ALT: 14 U/L (ref 0–35)
Alkaline Phosphatase: 69 U/L (ref 39–117)
CO2: 25 mEq/L (ref 19–32)
GFR calc Af Amer: >90 mL/min (ref >90)
GFR calc non Af Amer: >90 mL/min (ref >90)
Glucose, Bld: 93 mg/dL (ref 70–99)
Potassium: 3.9 mEq/L (ref 3.5–5.1)
Sodium: 135 mEq/L (ref 135–145)

## 2013-01-18 LAB — URINALYSIS, ROUTINE W REFLEX MICROSCOPIC
Bilirubin Urine: NEGATIVE
Glucose, UA: NEGATIVE mg/dL
Hgb urine dipstick: NEGATIVE
Nitrite: NEGATIVE
Specific Gravity, Urine: 1.02 (ref 1.005–1.030)
pH: 7 (ref 5.0–8.0)

## 2013-01-18 MED ORDER — OXYCODONE-ACETAMINOPHEN 5-325 MG PO TABS: 1.0000 | ORAL_TABLET | Freq: Four times a day (QID) | ORAL | Status: DC | PRN

## 2013-01-18 MED ORDER — OXYCODONE-ACETAMINOPHEN 5-325 MG PO TABS
1.0000 | ORAL_TABLET | Freq: Once | ORAL | Status: AC
  Administered 2013-01-18: 1 via ORAL
  Filled 2013-01-18: qty 1

## 2013-01-18 MED ORDER — HYDROMORPHONE HCL PF 1 MG/ML IJ SOLN
1.0000 mg | Freq: Once | INTRAMUSCULAR | Status: AC
  Administered 2013-01-18: 1 mg via INTRAVENOUS
  Filled 2013-01-18: qty 1

## 2013-01-18 MED ORDER — HYDROMORPHONE HCL PF 1 MG/ML IJ SOLN
0.5000 mg | Freq: Once | INTRAMUSCULAR | Status: AC
  Administered 2013-01-18: 0.5 mg via INTRAVENOUS
  Filled 2013-01-18: qty 1

## 2013-01-18 MED ORDER — ONDANSETRON HCL 4 MG/2ML IJ SOLN
4.0000 mg | Freq: Once | INTRAMUSCULAR | Status: AC
  Administered 2013-01-18: 4 mg via INTRAVENOUS
  Filled 2013-01-18: qty 2

## 2013-01-18 MED ORDER — LORAZEPAM 2 MG/ML IJ SOLN
1.0000 mg | Freq: Once | INTRAMUSCULAR | Status: AC
  Administered 2013-01-18: 1 mg via INTRAVENOUS
  Filled 2013-01-18: qty 1

## 2013-01-18 MED ORDER — IOHEXOL 300 MG/ML  SOLN
100.0000 mL | Freq: Once | INTRAMUSCULAR | Status: AC | PRN
  Administered 2013-01-18: 100 mL via INTRAVENOUS

## 2013-01-18 MED ORDER — IOHEXOL 300 MG/ML  SOLN
50.0000 mL | Freq: Once | INTRAMUSCULAR | Status: AC | PRN
  Administered 2013-01-18: 50 mL via ORAL

## 2013-01-18 NOTE — ED Notes (Signed)
Patient with no complaints at this time. Respirations even and unlabored. Skin warm/dry. Discharge instructions reviewed with patient at this time. Patient given opportunity to voice concerns/ask questions. IV removed per policy and band-aid applied to site. Patient discharged at this time and left Emergency Department with steady gait.  

## 2013-01-18 NOTE — ED Provider Notes (Signed)
CSN: [id number]     Arrival date & time 01/18/13  1324 History   This chart was scribed for Adrienne Lennert, MD, by Yevette Edwards, ED Scribe. This patient was seen in room APA09/APA09 and the patient's care was started at 2:28 PM. First MD Initiated Contact with Patient 01/18/13 1409     Chief Complaint  Patient presents with  . Pelvic Pain    Patient is a 32 y.o. female presenting with pelvic pain and abdominal pain. The history is provided by the patient. No language interpreter was used.  Pelvic Pain Associated symptoms include abdominal pain.  Abdominal Pain Pain location:  RLQ Pain radiates to:  Does not radiate Pain severity:  Severe Onset quality:  Gradual Duration:  2 weeks Timing:  Constant Progression:  Worsening Relieved by:  Nothing Worsened by:  Nothing tried Ineffective treatments:  None tried Associated symptoms: no chills, no dysuria, no fever and no vaginal discharge   Risk factors: not elderly and not obese    HPI Comments: ILIZA BLANKENBECKLER is a 32 y.o. female, with a h/o ovarian cysts, who presents to the Emergency Department complaining of gradually-increasing abdominal pain which has been occurring for several weeks but which severely worsened today. She states the pain is constant, and she rates it as 10/10. The pt denies experiencing any dysuria, flank pain, or vaginal discharge. She was treated approximately a month ago for similar symptoms, but she has not seen an Ob-Gyn due to lack of insurance. The pt is a daily smoker.  Past Medical History  Diagnosis Date  . Ovarian cyst    Past Surgical History  Procedure Laterality Date  . Tubal ligation     Family History  Problem Relation Age of Onset  . Rashes / Skin problems Mother    History  Substance Use Topics  . Smoking status: Current Every Day Smoker -- 1.00 packs/day    Types: Cigarettes  . Smokeless tobacco: Not on file  . Alcohol Use: No   Review of Systems  Constitutional: Negative for  fever and chills.  Gastrointestinal: Positive for abdominal pain.  Genitourinary: Positive for pelvic pain. Negative for dysuria and vaginal discharge.  All other systems reviewed and are negative.    Allergies  Ketorolac tromethamine and Tramadol  Home Medications   Current Outpatient Rx  Name  Route  Sig  Dispense  Refill  . acetaminophen (TYLENOL) 325 MG tablet   Oral   Take 650 mg by mouth as needed. For pain         . HYDROcodone-acetaminophen (NORCO/VICODIN) 5-325 MG per tablet   Oral   Take 1 tablet by mouth every 4 (four) hours as needed.   15 tablet   0   . promethazine (PHENERGAN) 25 MG tablet   Oral   Take 1 tablet (25 mg total) by mouth every 6 (six) hours as needed for nausea.   30 tablet   0    Triage Vitals: BP 143/109  Pulse 121  Temp(Src) 98.5 F (36.9 C) (Oral)  Resp 20  Ht 5\' 5"  (1.651 m)  Wt 165 lb (74.844 kg)  BMI 27.46 kg/m2  SpO2 98%  LMP 01/04/2013  Physical Exam  Constitutional: She is oriented to person, place, and time. She appears well-developed.  HENT:  Head: Normocephalic.  Eyes: Conjunctivae and EOM are normal. No scleral icterus.  Neck: Neck supple. No thyromegaly present.  Cardiovascular: Normal rate and regular rhythm.  Exam reveals no gallop and no friction rub.  No murmur heard. Pulmonary/Chest: No stridor. She has no wheezes. She has no rales. She exhibits no tenderness.  Abdominal: She exhibits no distension. There is tenderness. There is no rebound.  Moderate to severe tenderness RLQ.   Genitourinary:  Pelvic.  Tender right adenexal  Musculoskeletal: Normal range of motion. She exhibits no edema.  Lymphadenopathy:    She has no cervical adenopathy.  Neurological: She is oriented to person, place, and time. Coordination normal.  Skin: No rash noted. No erythema.  Psychiatric: She has a normal mood and affect. Her behavior is normal.    ED Course  Procedures (including critical care time)  DIAGNOSTIC  STUDIES: Oxygen Saturation is 98% on room air, normal by my interpretation.    COORDINATION OF CARE:  2:31 PM- Discussed treatment plan with patient, and the patient agreed to the plan.   Labs Review Labs Reviewed  URINALYSIS, ROUTINE W REFLEX MICROSCOPIC - Abnormal; Notable for the following:    APPearance HAZY (*)    All other components within normal limits  CBC WITH DIFFERENTIAL - Abnormal; Notable for the following:    WBC 12.0 (*)    Neutrophils Relative % 93 (*)    Neutro Abs 11.1 (*)    Lymphocytes Relative 5 (*)    Lymphs Abs 0.6 (*)    Monocytes Relative 1 (*)    All other components within normal limits  COMPREHENSIVE METABOLIC PANEL - Abnormal; Notable for the following:    BUN 5 (*)    Albumin 3.4 (*)    All other components within normal limits  PREGNANCY, URINE   Imaging Review No results found.  MDM  No diagnosis found.  The chart was scribed for me under my direct supervision.  I personally performed the history, physical, and medical decision making and all procedures in the evaluation of this patient.Adrienne Lennert, MD 01/18/13 [phone]

## 2013-01-18 NOTE — ED Notes (Signed)
Pt c/o continued pelvic pain, was seen in er on 12/21/2012 for the same pain, has not been able to follow up with GYN due to not having any insurance, states that the pain has become worse over the past few days,

## 2013-01-20 LAB — GC/CHLAMYDIA PROBE AMP: GC Probe RNA: NEGATIVE

## 2013-03-30 ENCOUNTER — Encounter (HOSPITAL_COMMUNITY): Payer: Self-pay | Admitting: Emergency Medicine

## 2013-03-30 ENCOUNTER — Emergency Department (HOSPITAL_COMMUNITY)
Admission: EM | Admit: 2013-03-30 | Discharge: 2013-03-30 | Disposition: A | Discharge status: Home / Self Care | Payer: Self-pay | Attending: Emergency Medicine | Admitting: Emergency Medicine

## 2013-03-30 DIAGNOSIS — R51 Headache: Secondary | ICD-10-CM | POA: Insufficient documentation

## 2013-03-30 DIAGNOSIS — Y9389 Activity, other specified: Secondary | ICD-10-CM | POA: Insufficient documentation

## 2013-03-30 DIAGNOSIS — F17200 Nicotine dependence, unspecified, uncomplicated: Secondary | ICD-10-CM | POA: Insufficient documentation

## 2013-03-30 DIAGNOSIS — X503XXA Overexertion from repetitive movements, initial encounter: Secondary | ICD-10-CM | POA: Insufficient documentation

## 2013-03-30 DIAGNOSIS — Y9289 Other specified places as the place of occurrence of the external cause: Secondary | ICD-10-CM | POA: Insufficient documentation

## 2013-03-30 DIAGNOSIS — S46811A Strain of other muscles, fascia and tendons at shoulder and upper arm level, right arm, initial encounter: Secondary | ICD-10-CM

## 2013-03-30 DIAGNOSIS — IMO0002 Reserved for concepts with insufficient information to code with codable children: Secondary | ICD-10-CM | POA: Insufficient documentation

## 2013-03-30 DIAGNOSIS — Z8742 Personal history of other diseases of the female genital tract: Secondary | ICD-10-CM | POA: Insufficient documentation

## 2013-03-30 MED ORDER — DEXAMETHASONE 4 MG PO TABS: ORAL_TABLET | ORAL | Status: DC

## 2013-03-30 MED ORDER — DIAZEPAM 5 MG PO TABS
5.0000 mg | ORAL_TABLET | Freq: Once | ORAL | Status: AC
  Administered 2013-03-30: 5 mg via ORAL
  Filled 2013-03-30: qty 1

## 2013-03-30 MED ORDER — HYDROCODONE-ACETAMINOPHEN 7.5-325 MG PO TABS: 1.0000 | ORAL_TABLET | ORAL | Status: DC | PRN

## 2013-03-30 MED ORDER — PREDNISONE 50 MG PO TABS
60.0000 mg | ORAL_TABLET | Freq: Once | ORAL | Status: AC
  Administered 2013-03-30: 20:00:00 60 mg via ORAL
  Filled 2013-03-30 (×2): qty 1

## 2013-03-30 MED ORDER — METHOCARBAMOL 500 MG PO TABS: 500.0000 mg | ORAL_TABLET | Freq: Three times a day (TID) | ORAL | Status: DC

## 2013-03-30 MED ORDER — HYDROCODONE-ACETAMINOPHEN 5-325 MG PO TABS
2.0000 | ORAL_TABLET | Freq: Once | ORAL | Status: AC
  Administered 2013-03-30: 2 via ORAL
  Filled 2013-03-30: qty 2

## 2013-03-30 MED ORDER — ONDANSETRON HCL 4 MG PO TABS
4.0000 mg | ORAL_TABLET | Freq: Once | ORAL | Status: AC
  Administered 2013-03-30: 4 mg via ORAL
  Filled 2013-03-30: qty 1

## 2013-03-30 NOTE — ED Notes (Signed)
Pt c/o neck pain that radiates to the rt shoulder and up back of head since moving her grandmother yesterday.

## 2013-03-30 NOTE — ED Notes (Signed)
Pain rt side of neck and shoulder since yesterday with headache.  Onset after moving her grandmother in bed.
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
## 2013-03-31 NOTE — ED Provider Notes (Signed)
Medical screening examination/treatment/procedure(s) were performed by non-physician practitioner and as supervising physician I was immediately available for consultation/collaboration.  EKG Interpretation   None        Glynn Octave, MD 03/31/13 0045

## 2013-10-14 ENCOUNTER — Emergency Department (HOSPITAL_COMMUNITY)
Admission: EM | Admit: 2013-10-14 | Discharge: 2013-10-15 | Disposition: A | Discharge status: Home / Self Care | Payer: Self-pay | Attending: Emergency Medicine | Admitting: Emergency Medicine

## 2013-10-14 ENCOUNTER — Encounter (HOSPITAL_COMMUNITY): Payer: Self-pay | Admitting: Emergency Medicine

## 2013-10-14 DIAGNOSIS — F17200 Nicotine dependence, unspecified, uncomplicated: Secondary | ICD-10-CM | POA: Insufficient documentation

## 2013-10-14 DIAGNOSIS — F1110 Opioid abuse, uncomplicated: Secondary | ICD-10-CM | POA: Insufficient documentation

## 2013-10-14 DIAGNOSIS — Z8742 Personal history of other diseases of the female genital tract: Secondary | ICD-10-CM | POA: Insufficient documentation

## 2013-10-14 DIAGNOSIS — F1910 Other psychoactive substance abuse, uncomplicated: Secondary | ICD-10-CM

## 2013-10-14 LAB — RAPID URINE DRUG SCREEN, HOSP PERFORMED
Amphetamines: NOT DETECTED
BARBITURATES: NOT DETECTED
BENZODIAZEPINES: NOT DETECTED
Cocaine: NOT DETECTED
Opiates: NOT DETECTED
Tetrahydrocannabinol: NOT DETECTED

## 2013-10-14 LAB — COMPREHENSIVE METABOLIC PANEL
ALBUMIN: 3.6 g/dL (ref 3.5–5.2)
ALK PHOS: 85 U/L (ref 39–117)
ALT: 13 U/L (ref 0–35)
AST: 21 U/L (ref 0–37)
BUN: 3 mg/dL — ABNORMAL LOW (ref 6–23)
CO2: 27 mEq/L (ref 19–32)
Calcium: 9.3 mg/dL (ref 8.4–10.5)
Chloride: 100 mEq/L (ref 96–112)
Creatinine, Ser: 0.53 mg/dL (ref 0.50–1.10)
GFR calc Af Amer: >90 mL/min (ref >90)
GFR calc non Af Amer: >90 mL/min (ref >90)
Glucose, Bld: 90 mg/dL (ref 70–99)
POTASSIUM: 3.9 meq/L (ref 3.7–5.3)
Sodium: 140 mEq/L (ref 137–147)
TOTAL PROTEIN: 7.8 g/dL (ref 6.0–8.3)
Total Bilirubin: 0.4 mg/dL (ref 0.3–1.2)

## 2013-10-14 LAB — CBC WITH DIFFERENTIAL/PLATELET
BASOS ABS: 0 10*3/uL (ref 0.0–0.1)
BASOS PCT: 1 % (ref 0–1)
EOS ABS: 0.2 10*3/uL (ref 0.0–0.7)
Eosinophils Relative: 3 % (ref 0–5)
HCT: 44.9 % (ref 36.0–46.0)
Hemoglobin: 15.7 g/dL — ABNORMAL HIGH (ref 12.0–15.0)
Lymphocytes Relative: 27 % (ref 12–46)
Lymphs Abs: 2.2 10*3/uL (ref 0.7–4.0)
MCH: 30.3 pg (ref 26.0–34.0)
MCHC: 35 g/dL (ref 30.0–36.0)
MCV: 86.7 fL (ref 78.0–100.0)
Monocytes Absolute: 0.4 10*3/uL (ref 0.1–1.0)
Monocytes Relative: 5 % (ref 3–12)
NEUTROS PCT: 64 % (ref 43–77)
Neutro Abs: 5.3 10*3/uL (ref 1.7–7.7)
PLATELETS: 291 10*3/uL (ref 150–400)
RBC: 5.18 MIL/uL — AB (ref 3.87–5.11)
RDW: 13.9 % (ref 11.5–15.5)
WBC: 8.1 10*3/uL (ref 4.0–10.5)

## 2013-10-14 LAB — POC URINE PREG, ED: PREG TEST UR: NEGATIVE

## 2013-10-14 LAB — ETHANOL: Alcohol, Ethyl (B): <11 mg/dL (ref 0–11)

## 2013-10-14 MED ORDER — METHOCARBAMOL 500 MG PO TABS
500.0000 mg | ORAL_TABLET | Freq: Three times a day (TID) | ORAL | Status: DC | PRN
  Administered 2013-10-15: 500 mg via ORAL
  Filled 2013-10-14: qty 1

## 2013-10-14 MED ORDER — ONDANSETRON 4 MG PO TBDP
4.0000 mg | ORAL_TABLET | Freq: Four times a day (QID) | ORAL | Status: DC | PRN
  Administered 2013-10-15: 4 mg via ORAL
  Filled 2013-10-14: qty 1

## 2013-10-14 MED ORDER — ALUM & MAG HYDROXIDE-SIMETH 200-200-20 MG/5ML PO SUSP: 30.0000 mL | ORAL | Status: DC | PRN

## 2013-10-14 MED ORDER — LORAZEPAM 1 MG PO TABS
1.0000 mg | ORAL_TABLET | Freq: Once | ORAL | Status: AC
  Administered 2013-10-14: 1 mg via ORAL
  Filled 2013-10-14: qty 1

## 2013-10-14 MED ORDER — LORAZEPAM 1 MG PO TABS
1.0000 mg | ORAL_TABLET | Freq: Three times a day (TID) | ORAL | Status: DC | PRN
Start: 2013-10-14 — End: 2013-10-15
  Administered 2013-10-14 – 2013-10-15 (×3): 1 mg via ORAL
  Filled 2013-10-14 (×3): qty 1

## 2013-10-14 MED ORDER — DICYCLOMINE HCL 10 MG PO CAPS: 20.0000 mg | ORAL_CAPSULE | Freq: Four times a day (QID) | ORAL | Status: DC | PRN

## 2013-10-14 MED ORDER — ONDANSETRON HCL 4 MG PO TABS: 4.0000 mg | ORAL_TABLET | Freq: Three times a day (TID) | ORAL | Status: DC | PRN

## 2013-10-14 MED ORDER — NICOTINE 21 MG/24HR TD PT24
21.0000 mg | MEDICATED_PATCH | Freq: Every day | TRANSDERMAL | Status: DC
  Administered 2013-10-14: 21 mg via TRANSDERMAL
  Filled 2013-10-14: qty 1

## 2013-10-14 MED ORDER — LOPERAMIDE HCL 2 MG PO CAPS: 2.0000 mg | ORAL_CAPSULE | ORAL | Status: DC | PRN

## 2013-10-14 MED ORDER — HYDROXYZINE HCL 25 MG PO TABS: 25.0000 mg | ORAL_TABLET | Freq: Four times a day (QID) | ORAL | Status: DC | PRN

## 2013-10-14 MED ORDER — ACETAMINOPHEN 325 MG PO TABS
650.0000 mg | ORAL_TABLET | ORAL | Status: DC | PRN
  Administered 2013-10-14 – 2013-10-15 (×2): 650 mg via ORAL
  Filled 2013-10-14 (×2): qty 2

## 2013-10-14 NOTE — ED Notes (Signed)
[facility], but has not been accepted.

## 2013-10-14 NOTE — ED Notes (Signed)
Pt & family advised that pt information has sent to [facility] but has not been accected anywhere at this point. Advised I would keep the up to date of any changes.

## 2013-10-14 NOTE — ED Provider Notes (Signed)
CSN: [id number]     Arrival date & time 10/14/13  1201 History  This chart was scribed for Toy BakerAnthony T Surina Storts, MD by Shari HeritageAisha Amuda, ED Scribe. The patient was seen in room APA11/AP[facility]11. Patient's care was started at 12:52 PM.  Chief Complaint  Patient presents with  . detox     The history is provided by the patient. No language interpreter was used.    HPI Comments: Ane PaymentMelanie H Helbing is a 33 y.o. female who presents to the Emergency Department requesting help with detoxification from opiates. Patient states that she has been taking at least 2 tablets of Opana per day. She states that she has been snorting or injecting into her arms. Her last use was yesterday. [facility] and [facility] prior to arrival and she was advised to come to the ED. She says that she has been through a rehabilitation program before several years ago. She denies any recent alcohol use. She denies SI or HI. She is not having any hallucinations. She denies any physical complaints at this time. She has no chronic medical conditions.    Past Medical History  Diagnosis Date  . Ovarian cyst    Past Surgical History  Procedure Laterality Date  . Tubal ligation     Family History  Problem Relation Age of Onset  . Rashes / Skin problems Mother    History  Substance Use Topics  . Smoking status: Current Every Day Smoker -- 1.00 packs/day    Types: Cigarettes  . Smokeless tobacco: Not on file  . Alcohol Use: No   OB History   Grav Para Term Preterm Abortions TAB SAB Ect Mult Living                 Review of Systems  Constitutional: Negative for fever and chills.  HENT: Negative for congestion, ear pain, rhinorrhea and sore throat.   Respiratory: Negative for cough and shortness of breath.   Cardiovascular: Negative for chest pain and leg swelling.  Gastrointestinal: Negative for vomiting and abdominal pain.  Genitourinary: Negative for difficulty urinating.  Musculoskeletal:  Negative for back pain.  Psychiatric/Behavioral: Negative for suicidal ideas and hallucinations.  All other systems reviewed and are negative.   Allergies  Ketorolac tromethamine and Tramadol  Home Medications   Prior to Admission medications   Medication Sig Start Date End Date Taking? Authorizing Provider  acetaminophen (TYLENOL) 325 MG tablet Take 650 mg by mouth as needed. For pain    Historical Provider, MD   Triage Vitals: BP 154/100  Pulse 105  Temp(Src) 97.8 F (36.6 C) (Oral)  Resp 16  Ht 5\' 5"  (1.651 m)  Wt 155 lb (70.308 kg)  BMI 25.79 kg/m2  SpO2 95%  LMP 10/10/2013 Physical Exam  Nursing note and vitals reviewed. Constitutional: She is oriented to person, place, and time. She appears well-developed and well-nourished.  Non-toxic appearance. No distress.  HENT:  Head: Normocephalic and atraumatic.  Eyes: Conjunctivae, EOM and lids are normal. Pupils are equal, round, and reactive to light.  Neck: Normal range of motion. Neck supple. No tracheal deviation present. No mass present.  Cardiovascular: Normal rate, regular rhythm and normal heart sounds.  Exam reveals no gallop.   No murmur heard. Pulmonary/Chest: Effort normal and breath sounds normal. No stridor. No respiratory distress. She has no decreased breath sounds. She has no wheezes. She has no rhonchi. She has no rales.  Abdominal: Soft. Normal appearance and bowel sounds are normal.  She exhibits no distension. There is no tenderness. There is no rebound and no CVA tenderness.  Musculoskeletal: Normal range of motion. She exhibits no edema and no tenderness.  Neurological: She is alert and oriented to person, place, and time. She has normal strength. No cranial nerve deficit or sensory deficit. GCS eye subscore is 4. GCS verbal subscore is 5. GCS motor subscore is 6.  Skin: Skin is warm and dry. No abrasion and no rash noted.  Psychiatric: She has a normal mood and affect. Her speech is normal and behavior is  normal.    ED Course  Procedures (including critical care time) DIAGNOSTIC STUDIES: Oxygen Saturation is 95% on room air, adequate by my interpretation.    COORDINATION OF CARE: 12:56 PM- Patient informed of current plan for treatment and evaluation and agrees with plan at this time.     Labs Review Labs Reviewed - No data to display  Imaging Review No results found.   EKG Interpretation None      MDM   Final diagnoses:  None   I personally performed the services described in this documentation, which was scribed in my presence. The recorded information has been reviewed and is accurate.  Spoke with behavior health and they will see the patient and disposition her  Toy BakerAnthony T Mahrukh Seguin, MD 10/14/13 1404

## 2013-10-14 NOTE — BH Assessment (Signed)
Tele Assessment Note   Ane PaymentMelanie H Levy is an 33 y.o. female who presents seeking detox from Opana.  She reports using 20-100 mg of Opana daily and taking various pain pills whenever she can't get the Opana.  She primarily uses the Opana IV.  She reports a period of sobriety that was about 2 mos long last year, but spending time around her sister who uses led to her relapse.  She states she has been using for the better part of the last 4-5 years.  She reports she wants to get clean because she's tired of constantly chasing the drugs, but knows she cannot do it on her own because the withdrawal symptoms are unbearable.  She says she has some trouble with anxiety and concentration, which are worse when she's not high and that she's also overwhelmed with stress due to not having her children (ages 8010 and 8512), not having her own place to live, and finding transportation to work, but she does go to work every day and is able to function despite her significant drug use.  She denies any thoughts of SI, HI, or AVH, now or in the last six months.  She reports one previous attempt at detox a couple of years ago at [facility], but states she realizes now that she needs to find a treatment program to follow up with to truly be successful.     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 Ovarian cyst    Axis IV: housing problems, problems with access to health care services and problems with primary support group Axis V: 41-50 serious symptoms  Past Medical History:  Past Medical History  Diagnosis Date  . Ovarian cyst     Past Surgical History  Procedure Laterality Date  . Tubal ligation      Family History:  Family History  Problem Relation Age of Onset  . Rashes / Skin problems Mother     Social History:  reports that she has been smoking Cigarettes.  She has been smoking about 1.00 pack per day. She does not have any  smokeless tobacco history on file. She reports that she does not drink alcohol or use illicit drugs.  Additional Social History:  Alcohol / Drug Use History of alcohol / drug use?: Yes Substance #1 Name of Substance 1: Opana 1 - Age of First Use: 27 1 - Amount (size/oz): no less than 20 mg daily up to 100mg  1 - Frequency: daily 1 - Duration: 4 -5 years 1 - Last Use / Amount: 10/13/13 20mg  Substance #2 Name of Substance 2: Pain Pills 2 - Age of First Use: 25 2 - Amount (size/oz): varies 2 - Frequency: when unable to get opana  2 - Duration: ongoing 2 - Last Use / Amount: a few days ago  CIWA: CIWA-Ar BP: 143/90 mmHg Pulse Rate: 84 Nausea and Vomiting: no nausea and no vomiting Tremor: two Paroxysmal Sweats: barely perceptible sweating, palms moist Visual Disturbances: mild sensitivity Anxiety: mildly anxious Headache, Fullness in Head: moderate Orientation and Clouding of Sensorium: oriented and can do serial additions COWS: Clinical Opiate Withdrawal Scale (COWS) Resting Pulse Rate: Pulse Rate 81-100 Sweating: Subjective report of chills or flushing Restlessness: Reports difficulty sitting still, but is able to do so Pupil Size: Pupils possibly larger than normal for room light Bone or Joint Aches: Patient reports sever diffuse aching of joints/muscles Runny Nose or Tearing: Nasal  stuffiness or unusually moist eyes GI Upset: Stomach cramps Tremor: Slight tremor observable Yawning: No yawning Anxiety or Irritability: Patient reports increasing irritability or anxiousness Gooseflesh Skin: Skin is smooth COWS Total Score: 11  Allergies:  Allergies  Allergen Reactions  . Ketorolac Tromethamine Shortness Of Breath  . Tramadol Hives    Home Medications:  (Not in a hospital admission)  OB/GYN Status:  Patient's last menstrual period was 10/10/2013.  General Assessment Data Location of Assessment: [facility] ED Is this a Tele or Face-to-Face Assessment?: Tele Assessment Is  this an Initial Assessment or a Re-assessment for this encounter?: Initial Assessment Living Arrangements: Other relatives (grandparents) Can pt return to current living arrangement?: Yes Admission Status: Voluntary Is patient capable of signing voluntary admission?: Yes Transfer from: Acute Hospital Referral Source: Self/Family/Friend     [facility] Living Arrangements: Other relatives (grandparents)  Education Status Is patient currently in school?: No Highest grade of school patient has completed: GED  Risk to self Suicidal Ideation: No Suicidal Intent: No Is patient at risk for suicide?: No Suicidal Plan?: No Access to Means: No What has been your use of drugs/alcohol within the last 12 months?: opana Previous Attempts/Gestures: No Triggers for Past Attempts: None known Intentional Self Injurious Behavior: None Family Suicide History: Yes (mother) Recent stressful life event(s): Other (Comment) (transportation, housing, not having her kids with her-10, 12) Persecutory voices/beliefs?: No Depression: Yes Depression Symptoms: Fatigue;Insomnia;Isolating;Loss of interest in usual pleasures;Feeling angry/irritable Substance abuse history and/or treatment for substance abuse?: Yes Suicide prevention information given to non-admitted patients: Not applicable  Risk to Others Homicidal Ideation: No Thoughts of Harm to Others: No Current Homicidal Intent: No Current Homicidal Plan: No Access to Homicidal Means: No History of harm to others?: No Assessment of Violence: None Noted Does patient have access to weapons?: No Criminal Charges Pending?: No Does patient have a court date: No  Psychosis Hallucinations: None noted Delusions: None noted  Mental Status Report Appear/Hygiene: Unremarkable Eye Contact: Good Motor Activity: Restlessness Speech: Logical/coherent Level of Consciousness: Alert Mood: Depressed Affect: Appropriate to circumstance Anxiety  Level: Minimal Thought Processes: Coherent;Relevant Judgement: Unimpaired Orientation: Person;Place;Time;Situation Obsessive Compulsive Thoughts/Behaviors: None  Cognitive Functioning Concentration: Decreased Memory: Remote Intact;Recent Intact IQ: Average Insight: Good Impulse Control: Fair Appetite: Poor Weight Loss: 9 Weight Gain: 0 Sleep: Decreased Total Hours of Sleep: 3 Vegetative Symptoms: None  ADLScreening [facility] Services) Patient's cognitive ability adequate to safely complete daily activities?: Yes Patient able to express need for assistance with ADLs?: Yes Independently performs ADLs?: Yes (appropriate for developmental age)  Prior Inpatient Therapy Prior Inpatient Therapy: Yes Prior Therapy Dates: a few years ago Prior Therapy Facilty/Provider(s): [facility] Reason for Treatment: Detox  Prior Outpatient Therapy Prior Outpatient Therapy: No  ADL Screening (condition at time of admission) Patient's cognitive ability adequate to safely complete daily activities?: Yes Patient able to express need for assistance with ADLs?: Yes Independently performs ADLs?: Yes (appropriate for developmental age)       Abuse/Neglect Assessment (Assessment to be complete while patient is alone) Physical Abuse: Denies Verbal Abuse: Denies Sexual Abuse: Denies Values / Beliefs Cultural Requests During Hospitalization: None Spiritual Requests During Hospitalization: None     Nutrition Screen- [facility] Adult/[facility] Patient's home diet: Regular  Additional Information 1:1 In Past 12 Months?: No CIRT Risk: No Elopement Risk: No Does patient have medical clearance?: Yes     Disposition:  Disposition Initial Assessment Completed for this Encounter: Yes Disposition of Patient: Inpatient treatment program Type of inpatient treatment  program: Adult  Steward Ros 10/14/2013 3:16 PM

## 2013-10-14 NOTE — ED Notes (Signed)
Pt states she cant stay still & that the ativan did not help. Pt informed that that was an extra dose until next prn does can be given.

## 2013-10-14 NOTE — ED Notes (Signed)
Pt states she is here for detox of opiates. Pt states she has been using for 4-5 years and states she last used yesterday. Pt denies SI/HI. Pt presents with tremors.

## 2013-10-14 NOTE — BH Assessment (Signed)
[facility] Assessment Progress Note      Spoke with Dr Adrienne Levy who requests a [facility] consult for the patient.  Ms Adrienne Levy reports she is abusing opana and spoke with [facility] and [facility] but was told she needed to come to the ED for referral. She denies SI/HI/AVH. Labs in process.  [facility]ne [facility] at [facility] reports they have female beds available.  [facility] [place] [facility]

## 2013-10-14 NOTE — ED Notes (Signed)
Here for detox from opiates, last used yesterday.  Denies SI/HI.

## 2013-10-15 NOTE — [facility] Counselor (Signed)
[facility] affairs officercall writer back. [facility] may not be [facility] avaialble.  Writer called [facility]. Tammy SoursGreg reports they haven't received a referral from [facility]. He says that they have one female bed left and [facility] is welcome to send in referral.   Evette Cristalaroline Paige McLean, LCSWA Assessment Counselor

## 2013-10-15 NOTE — ED Notes (Signed)
Pt sleeping comfortably at this time.

## 2013-10-15 NOTE — ED Notes (Signed)
Pt sleeping at this time. Attempted to talk with patient about status of placement but pt wanted to continue sleeping. Will attempt to talk with her further when she wakes fully. LCSW placed a note stating that [facility] would welcome a referral for pt to be placed there. Will inform pt of referral.

## 2013-10-15 NOTE — ED Notes (Signed)
Pt was due to have vitals at before departure. Pt verbalized "I want to leave now" and would not allow for vitals to be assessed. Pt was A&Ox4, in NAD, calm, not agitated, and ambulated with no problems from her room out to her mother's car. Pt verbalized understanding of discharge instructions.

## 2013-10-15 NOTE — ED Notes (Signed)
Spoke with Shelbie HutchingKathy Watkins, mother of pt. Ms. Adrienne Levy in concerned over where her daughter would be placed. Mother stated "I'm terrified that only [facility] will take her because it's only a 3 day program and that has failed her before." Ms Adrienne Levy was informed that bed placement was being worked on right now and that we would update her daughter when we receive confirmation. Ms. Adrienne Levy verbalized understanding and expressed thanks. Ms Adrienne Levy left contact information if needed: phone: [phone]

## 2013-10-15 NOTE — Discharge Instructions (Signed)
As discussed, if you change your mind about seeking inpatient treatment, do not hesitate to return here.  Otherwise, please take advantage of the provided resources to pain assistance with your substance abuse.    Emergency Department Resource Guide 1) Find a Doctor and Pay Out of Pocket Although you won't have to find out who is covered by your insurance plan, it is a good idea to ask around and get recommendations. You will then need to call the office and see if the doctor you have chosen will accept you as a new patient and what types of options they offer for patients who are self-pay. Some doctors offer discounts or will set up payment plans for their patients who do not have insurance, but you will need to ask so you aren't surprised when you get to your appointment.  2) Contact Your Local Health Department Not all health departments have doctors that can see patients for sick visits, but many do, so it is worth a call to see if yours does. If you don't know where your local health department is, you can check in your phone book. The CDC also has a tool to help you locate your state's health department, and many state websites also have listings of all of their local health departments.  3) Find a Walk-in Clinic If your illness is not likely to be very severe or complicated, you may want to try a walk in clinic. These are popping up all over the country in pharmacies, drugstores, and shopping centers. They're usually staffed by nurse practitioners or physician assistants that have been trained to treat common illnesses and complaints. They're usually fairly quick and inexpensive. However, if you have serious medical issues or chronic medical problems, these are probably not your best option.  No Primary Care Doctor: - Call Health Connect at  [phone] - they can help you locate a primary care doctor that  accepts your insurance, provides certain services, etc. - Physician Referral Service-  [phone]  Chronic Pain Problems: Organization         Address  Phone   Notes  [facility]  [phone]) [phone] Patients need to be referred by their primary care doctor.   Medication Assistance: Organization         Address  Phone   Notes  [facility] [address] [place], [place] [phone] --Must be a resident of [facility] -- Must have NO insurance coverage whatsoever (no Medicaid/ Medicare, etc.) -- The pt. MUST have a primary care doctor that directs their care regularly and follows them in the community   MedAssist  [phone]) [phone]   [employer]  [phone]) [phone]    Agencies that provide inexpensive medical care: Organization         Address  Phone   Notes  [facility]  [phone]) [phone]   [facility]    [phone]   [facility] [address] [place], [place] [address] [phone]) [phone]   [facility] [address], [place] [phone]   Planned Parenthood    [phone]) [phone]   [facility]    [phone]   [facility]  [address], [place] Phone:  [phone], Fax:  [phone] Hours of Operation:  9 am - 6 pm, M-F.  Also accepts Medicaid/Medicare and self-pay.  [facility]  [address], [place] Phone: [phone], Fax: [phone]. Hours of Operation:  8:30 am - 5:30 pm, M-F.  Also accepts Medicaid and self-pay.  [facility] [place] [address] [place] Phone: [phone]   [facility] [address] Natasha Bence [place], [place] [phone], Ext. [phone] Mondays & Thursdays: 7-9 AM.  First 15 patients are seen on a first come, first serve basis.    Medicaid-accepting [facility] Providers:  Organization         Address  Phone   Notes  [facility] [address],  [place] [phone] Also accepts self-pay patients.  [facility] [address] Laurell Josephs [place], [place]  [phone]   [facility] [address], [place] [phone]   [facility] [address] [place] [phone]   Renaye Rakers [address], [place]   [phone] Only accepts [place] patients after they have their name applied to their card.   Self-Pay (no insurance) in [facility]:  Organization         Address  Phone   Notes  Sickle Cell Patients, [facility] [address] [place], [place] [phone]   [facility] [address] [place], [place] [phone]   [facility]  [address], [place] [phone]   [facility]/Dr. Osei-Bonsu  [address] [place] or [address], [place] [phone] Phone number for both [place] and [place] locations is the same.  [facility] and [facility] [address], [place] [phone]   [facility] [address], [place] or [address] [phone]   [facility] [address] [place] [phone], phone; [phone], fax Sees patients 1st and 3rd Saturday of every month.  Must not qualify for public or private insurance (i.e. Medicaid, Medicare, [place] Health Choice, Veterans' Benefits)  Household income should be no more than 200% of the poverty level The clinic cannot treat you if you are pregnant or think you are pregnant  Sexually transmitted diseases are not treated at the clinic.   Dental Care: Organization         Address  Phone  Notes  [facility] [address] [place], [place] [phone] Accepts children up to age 70 who are enrolled in [place]  or [place] Health Choice; pregnant women with a Medicaid card; and children who have applied for Medicaid or [place] Health Choice, but were declined, whose parents can pay a reduced fee at time of service.  [facility]  [address], [place] [phone] Accepts children up to age 39 who are enrolled in [place] or [place] Health Choice; pregnant women with a Medicaid card; and children who have applied for Medicaid or [place] Health Choice, but were declined, whose parents can pay a reduced fee at time of service.  [facility]  [address] [place], [place] [phone] Patients are seen by appointment only. Walk-ins are not accepted. [facility] will see patients 31 years of age and older. Monday - Tuesday (8am-5pm) Most Wednesdays (8:30-5pm) $30 per visit, cash only  [employer]  Adult Dental Access PROGRAM  [address], [facility] [phone] Patients are seen by appointment only. Walk-ins are not accepted. [facility] will see patients 31 years of age and older. One Wednesday Evening (Monthly: Volunteer Based).  $30 per visit, cash only  [employer]  [phone] for adults; Children under age 65, call Graduate Pediatric Dentistry at [phone]. Children aged 81-14, please call [phone] to request a pediatric application.  Dental services are provided in all areas of dental care including fillings, crowns and bridges, complete and partial dentures, implants, gum treatment, root canals, and extractions. Preventive care is also provided. Treatment is provided to both adults and children. Patients are selected via a lottery and there is often a waiting list.   [facility] [address] [place]  [phone] [email or web address]   [facility] [address] [place], [place] [phone], Ext. [phone] Second and Fourth Thursday of each month, opens at 6:30 AM; Clinic  ends at 9 AM.  Patients are seen on a first-come first-served basis, and a limited number are seen during each clinic.   [facility]  [address] Ether Griffins [place], [place] [phone]   Eligibility Requirements You must have lived in [place], [place], or [place] counties for at least the last three months.   You cannot be eligible for state or federal sponsored [place], including [employer], [place], or [employer].   You generally cannot be eligible for healthcare insurance through your employer.    How to apply: Eligibility screenings are held every Tuesday and Wednesday afternoon from 1:00 pm until 4:00 pm. You do not need an appointment for the interview!  [facility] [address] [place], [place] [phone]   [facility] Health Department  [phone]   [facility] Health Department  [phone]   [facility] Health Department  [phone]    Behavioral Health Resources in the Community: Intensive Outpatient Programs Organization         Address  Phone  Notes  [facility] [address], [place], [place] [phone]   [facility] Outpatient [address] [place], [place] [phone]   [facility] [address], [place], [place]  [phone]   [facility] [address]  [place], [place] [phone] or [phone]   Substance Abuse Resources Organization         Address  Phone  Notes  Alcohol [facility] Drug Services  [phone]   [facility]  [phone]   [facility] [place]  [phone]   Floydene Flock  [phone]   Residential & Outpatient Substance Abuse Program  [phone]   Psychological Services Organization         Address  Phone  Notes  [facility]  [phone]   [facility]  [phone]   [facility] [address], [place] [phone] or  [phone]    Mobile Crisis Teams Organization         Address  Phone  Notes  [facility], Mobile Crisis Care Unit  [phone]   [facility]  [address] [place], [place] [phone]   Doristine Locks [address] [place] [phone]    Self-Help/Support Groups Organization         Address  Phone             Notes  [facility] [place] - variety of support groups  336- [phone] Call for more information  Narcotics Anonymous (NA), [facility] [address], [place]  2 meetings at this location   Statisticianesidential Treatment Programs Organization         Address  Phone  Notes  [facility] [address],    [place]  [phone]   [facility]  [address], [place] [phone], [place], [place] [phone]   [facility] [address], [place] [phone] Admissions: 8am-3pm M-F  [facility] [address]    [place], [place] [phone]   [facility] [address], [place], [place] [phone]   [facility] [address]  [place], [place] [phone]   [facility] - Intensive Outpatient [address], [place], [place] [phone]   [facility] ([facility] [address]  [place], [place] [phone] or [phone]   [facility] ([facility]) [address] [place], [place] [phone] Accepts Medicaid  [facility] [address]  [place] [phone] Substance Abuse/Addiction Treatment   [facility] Behavioral Health Resources Organization         Address  Phone  Notes  [facility]  [phone]) [phone]   Angie FavaJulie Brannon, PhD [address], [address] [place], [place]   [phone]) [phone] or [phone]) [phone]   [facility]   [address] [place], [place] [phone](336) [phone]   [facility] [address],  [place], [place] [phone]) [phone] Insurance/Medicaid/sponsorship through [facility]  [facility] [address]                                    [place], [place] [phone] Therapy/tele-psych/case  [facility] [address]   [place], [place] [phone]) [phone]    Dr. Lolly MustacheArfeen  [phone]) [phone]   [facility] [place] [place]  [employer] [facility] 1) [address], [place] 2) [address], [place] 3)  [address], [place] [phone]) [phone]  [phone](336) [phone]   [facility] Child Abuse Hotline [phone](336) [phone] or [phone](336) [phone] (After Hours)

## 2013-10-15 NOTE — ED Provider Notes (Signed)
12:43 PM Patient is requesting discharge. Had a lengthy conversation with the patient about the queue for receiving inpatient services, and the shortage of the services available for this. Patient is in no distress, is awake, alert, capable of making decisions. Patient's mother is also present, voices concern over the patient's decision-making, but the patient again, has capacity to request for discharge. Patient's request was accommodated, and she was provided additional resources for outpatient treatment, then discharged, per her request.  Gerhard Munchobert Lockwood, MD 10/15/13 1244

## 2013-10-24 ENCOUNTER — Encounter (HOSPITAL_COMMUNITY): Payer: Self-pay | Admitting: Emergency Medicine

## 2013-10-24 ENCOUNTER — Emergency Department (HOSPITAL_COMMUNITY)
Admission: EM | Admit: 2013-10-24 | Discharge: 2013-10-24 | Disposition: A | Discharge status: Home / Self Care | Payer: Self-pay | Attending: Emergency Medicine | Admitting: Emergency Medicine

## 2013-10-24 DIAGNOSIS — R51 Headache: Secondary | ICD-10-CM | POA: Insufficient documentation

## 2013-10-24 DIAGNOSIS — R112 Nausea with vomiting, unspecified: Secondary | ICD-10-CM | POA: Insufficient documentation

## 2013-10-24 DIAGNOSIS — F19939 Other psychoactive substance use, unspecified with withdrawal, unspecified: Principal | ICD-10-CM | POA: Insufficient documentation

## 2013-10-24 DIAGNOSIS — M549 Dorsalgia, unspecified: Secondary | ICD-10-CM | POA: Insufficient documentation

## 2013-10-24 DIAGNOSIS — F1123 Opioid dependence with withdrawal: Secondary | ICD-10-CM

## 2013-10-24 DIAGNOSIS — Z79899 Other long term (current) drug therapy: Secondary | ICD-10-CM | POA: Insufficient documentation

## 2013-10-24 DIAGNOSIS — F17200 Nicotine dependence, unspecified, uncomplicated: Secondary | ICD-10-CM | POA: Insufficient documentation

## 2013-10-24 DIAGNOSIS — F1120 Opioid dependence, uncomplicated: Secondary | ICD-10-CM | POA: Insufficient documentation

## 2013-10-24 DIAGNOSIS — R450 Nervousness: Secondary | ICD-10-CM | POA: Insufficient documentation

## 2013-10-24 DIAGNOSIS — Z8742 Personal history of other diseases of the female genital tract: Secondary | ICD-10-CM | POA: Insufficient documentation

## 2013-10-24 HISTORY — DX: Opioid dependence, uncomplicated: F11.20

## 2013-10-24 LAB — COMPREHENSIVE METABOLIC PANEL
ALT: 12 U/L (ref 0–35)
AST: 20 U/L (ref 0–37)
Albumin: 3.6 g/dL (ref 3.5–5.2)
Alkaline Phosphatase: 72 U/L (ref 39–117)
BUN: 4 mg/dL — ABNORMAL LOW (ref 6–23)
CALCIUM: 9.1 mg/dL (ref 8.4–10.5)
CO2: 29 mEq/L (ref 19–32)
CREATININE: 0.61 mg/dL (ref 0.50–1.10)
Chloride: 98 mEq/L (ref 96–112)
GFR calc non Af Amer: >90 mL/min (ref >90)
Glucose, Bld: 89 mg/dL (ref 70–99)
Potassium: 3.3 mEq/L — ABNORMAL LOW (ref 3.7–5.3)
Sodium: 140 mEq/L (ref 137–147)
Total Bilirubin: 0.5 mg/dL (ref 0.3–1.2)
Total Protein: 7.4 g/dL (ref 6.0–8.3)

## 2013-10-24 LAB — CBC
HEMATOCRIT: 43.4 % (ref 36.0–46.0)
Hemoglobin: 14.7 g/dL (ref 12.0–15.0)
MCH: 29.8 pg (ref 26.0–34.0)
MCHC: 33.9 g/dL (ref 30.0–36.0)
MCV: 87.9 fL (ref 78.0–100.0)
PLATELETS: 270 10*3/uL (ref 150–400)
RBC: 4.94 MIL/uL (ref 3.87–5.11)
RDW: 13.9 % (ref 11.5–15.5)
WBC: 7.5 10*3/uL (ref 4.0–10.5)

## 2013-10-24 LAB — SALICYLATE LEVEL

## 2013-10-24 LAB — ETHANOL: Alcohol, Ethyl (B): <11 mg/dL (ref 0–11)

## 2013-10-24 LAB — ACETAMINOPHEN LEVEL: Acetaminophen (Tylenol), Serum: <15 ug/mL (ref 10–30)

## 2013-10-24 MED ORDER — LORAZEPAM 1 MG PO TABS: 1.0000 mg | ORAL_TABLET | Freq: Three times a day (TID) | ORAL | Status: DC | PRN

## 2013-10-24 MED ORDER — LORAZEPAM 1 MG PO TABS: 2.0000 mg | ORAL_TABLET | Freq: Four times a day (QID) | ORAL | Status: DC | PRN

## 2013-10-24 MED ORDER — CLONIDINE HCL 0.1 MG PO TABS
0.2000 mg | ORAL_TABLET | Freq: Three times a day (TID) | ORAL | Status: DC
  Administered 2013-10-24: 0.2 mg via ORAL
  Filled 2013-10-24: qty 2

## 2013-10-24 MED ORDER — CLONIDINE HCL 0.2 MG PO TABS
0.2000 mg | ORAL_TABLET | Freq: Two times a day (BID) | ORAL | Status: DC
Start: 2013-10-24 — End: 2015-07-20

## 2013-10-24 NOTE — Discharge Instructions (Signed)
Avoid using all forms of narcotics. The prescribed medicines can help you get through the early withdrawal symptoms. It is important to make the decision to quit using narcotics, and avoid, interpersonal associations, which tend to encourage you to use drugs. Followup for treatment with a therapist, or drug treatment center if you prefer to get help with avoidance, and treatment, for narcotic addiction.   Narcotic Withdrawal When drug use interferes with normal living activities and relationships, it is abuse. Abuse includes problems with family and friends. Psychological dependence has developed when your mind tells you that the drug is needed. This is usually followed by a physical dependence in which you need more of the drug to get the same feeling or "high." This is known as addiction or chemical dependency. Risk is greater when chemical dependency exists in the family. SYMPTOMS  When tolerance to narcotics has developed, stopping of the narcotic suddenly can cause uncomfortable physical symptoms. Most of the time these are mild and consist of shakes or jitters (tremors) in the hands,a rapid heart rate, rapid breathing, and temperature. Sometimes these symptoms are associated with anxiety, panic attacks, and bad dreams. Other symptoms include:  Irritability.  Anxiety.  Runny nose.  "Goose flesh."  Diarrhea.  Feeling sick to the stomach (nauseous).  Muscle spasms.  Sleeplessness.  Chills.  Sweats.  Drug cravings.  Confusion. The severity of the withdrawal is based on the individual and varies from person to person. Many people choose to continue using narcotics to get rid of the discomfort of withdrawal. They also use to try to feel normal. TREATMENT  Quitting an addiction means stopping use of all chemicals. This is hard but may save your life. With continual drug use, possible outcomes are often loss of self respect and esteem, violence, death, and eventually prison if the  use of narcotics has led to the death of another. Addiction cannot be cured, but it can be stopped. This often requires outside help and the care of professionals. Most hospitals and clinics can refer you to a specialized care center. It is not necessary for you to go through the uncomfortable symptoms of withdrawal. Your caregiver can provide you with medications that will help you through this difficult period. Try to avoid situations, friends, or alcohol, which may have made it possible for you to continue using narcotics in the past. Learn how to say no! HOME CARE INSTRUCTIONS   Drink fluids, get plenty of rest, and take hot baths.  Medicines may be prescribed to help control withdrawal symptoms.  Over-the-counter medicines may be helpful to control diarrhea or an upset stomach.  If your problems resulted from taking prescription pain medicines, make sure you have a follow-up visit with your caregiver within the next few days. Be open about this problem.  If you are dependent or addicted to street drugs, contact a local drug and alcohol treatment center or Narcotics Anonymous.  Have someone with you to monitor your symptoms.  Engage in healthy activities with friends who do not use drugs.  Stay away from the drug scene. It takes a long period of time to overcome addictions to all drugs. There may be times when you feel as though you want to use. Following loss of a physical addiction and going through withdrawal, you have conquered the most difficult part of getting rid of an addiction. Gradually, you will have a lessening of the craving that is telling you that you need narcotics to feel normal. Call your caregiver or  a member of your support group if more support is needed. Learn who to talk to in your family and among your friends so that during these periods you can receive outside help. SEEK IMMEDIATE MEDICAL CARE IF:   You have vomiting that cannot be controlled, especially if you  cannot keep liquids down.  You are seeing things or hearing voices that are not really there (hallucinating).  You have a seizure. Document Released: 07/07/2002 Document Revised: 07/09/2011 Document Reviewed: 04/11/2008 [facility] Patient Information 2015 [place], [place]. This information is not intended to replace advice given to you by your health care provider. Make sure you discuss any questions you have with your health care provider.  Opiate Dependence The above names are all different names used for opiates. Opiates are any medication made from the poppy plant. It is a medication which produces a calming, sleepy effect. Because achieving this effect requires more and more of this drug to get the same result, opiates become addictive. A family history of addiction or an addictive personality increases the risk. When drug use is interfering with normal living activities, it has become abuse. This includes problems with family, friends, and your job. Psychological dependence has developed when your mind tells you that the drug is needed. This emotional dependence is the craving for the "high" that some drugs cause. Emotional addicts always want this high instead of the way they are feeling when not using the drug. This is difficult to overcome. This is usually followed by physical dependence that has developed when continuing increases of drug are required to get the same feeling or "high". This is known as addiction or chemical dependency.  SIGNS OF CHEMICAL DEPENDENCY  Friends and family tell you there is a problem.  Fighting when using drugs.  Mood swings and insomnia.  Forgetfulness.  Not remembering what you do while using (blackouts).  Feeling sick from using drugs but you continue using.  Lie about use or amounts of drugs (chemicals) used.  Need chemicals to get you going.  Suffer in work International aid/development workerperformance or school because of drug use.  Need drugs to relate to people or feel  comfortable in social situations.  Use drugs to forget problems.  Difficulty with attention.  Neglecting obligations. If you answered "yes" to any or some of the above signs of chemical dependency, you may have a problem. The longer the use of drugs continues, the greater the problems will become. Do not experiment with drugs.  SIGNS AND SYMPTOMS OF PHYSICAL DEPENDENCE  Sudden stopping of the narcotic is uncomfortable when tolerance has developed. Physical problems will develop. This is called withdrawal.  How bad the withdrawal is varies from person to person. Some of the smaller problems are:  Tremors in the hands or shakes and jitters.  A fast heart rate and rapid breathing.  An increase in temperature.  Anxiety and panic attacks with bad dreams.  Muscle aches and pains. You may have more serious problems. These can include:  Feeling sick to your stomach or throwing up.  Dehydration develops if you cannot keep fluids down.  Tremors and chills or fever with sweating and anxiety.  Hallucinations and cravings.  Body aching with restlessness and insomnia.  Seizures or convulsions. These problems can last for months. These uncomfortable feeling can cause you to use drugs again just to feel better. OTHER HEALTH RISKS OF NARCOTICS USE INCLUDE:  The increased possibility of getting AIDS, hepatitis, other infectious or sexually transmitted diseases.  Unplanned pregnancy and  having a baby born addicted to narcotics. You then put your baby through painful withdrawal symptoms including: shaking, jerking, and crying in pain. Many babies die. Other babies have lifelong disabilities and learning problems. TREATMENT  Effective treatment and management of narcotic addiction requires a multi-faceted, team approach that includes:  Medications to minimize the symptoms of narcotic withdrawal.  Medications to reduce the need for continued narcotic use.  Medical management of unrelated  medical problems.  Pain management.  Social services.  Psychological treatment.  Behavioral therapies. Stopping your dependence is hard but may save your life. If you continue using drugs, the only possible outcome is loss of self respect and esteem, violence, and eventually prison or death. To stop abuse, you must first realize you have a problem. You control your behavior. Once you realize this, commit to quitting. Addiction is a disease. You need medical help to get well. Your caregiver can counsel you or refer you for counseling. The best way to do this is to seek out an organization for help. These include Alcoholics Anonymous, Narcotics Anonymous, or the [employer] on Alcoholism and Drug Dependence. HOW TO STAY CLEAN WHEN YOU HAVE QUIT USING  Develop healthy activities.  Form friendships with those who do not use drugs.  Stay away from all drugs. Alcohol will lessen your ability to say no.  Have ready excuses available about why you cannot use. If that is difficult, stay away from people who knew you used. HOME CARE INSTRUCTIONS   Both prescription medicines and over-the-counter medicines are used as part of the treatment. It is critical to follow the recommendations of your caregiver at home. Any additional over-the-counter medications or changes in the recommended treatment plan should be discussed with your caregiver first.  It is important to keep fluids down. Juices, soda, Gatorade, or a mixture will help prevent dehydration.  Be prepared for the emotional swings of quitting.  Call your local emergency services if seizures (convulsions) occur or if you are unable to keep liquids down.  Keep a written record of medications you take and times given. Overcoming addictions takes years. Over time you will have a lessening of the craving for narcotics. Talk to your caregiver or a member of your support group if you need more help.  Addiction cannot be cured but it can be  stopped. Treatment centers are listed in the yellow pages under: Cocaine, Narcotics, and Alcoholics Anonymous. Most hospitals and clinics can refer you to a specialized care center. Document Released: 02/11/2007 Document Revised: 07/09/2011 Document Reviewed: 02/11/2007 [facility] Patient Information 2015 [place], [place]. This information is not intended to replace advice given to you by your health care provider. Make sure you discuss any questions you have with your health care provider.

## 2013-10-24 NOTE — ED Notes (Signed)
Pt verbalized understanding of resources and medication available

## 2013-10-24 NOTE — ED Provider Notes (Signed)
CSN: [id number]     Arrival date & time 10/24/13  1839 History   First MD Initiated Contact with Patient 10/24/13 2117     Chief Complaint  Patient presents with  . opiate withrawal      (Consider location/radiation/quality/duration/timing/severity/associated sxs/prior Treatment) The history is provided by the patient.   Adrienne Levy is a 33 y.o. female who states that she needs help for symptoms, associated with jaw from use of opiates. She has been using Opana or Dilaudid, whatever she can get, for her narcotics addiction. She last used yesterday. At this time; she has a headache, back pain, nausea, vomiting, nervousness, and feels bad. [facility] one week ago, stayed 24 hours, then left. She has not followed up for treatment as was recommended. She denies recent fever, chills, chest pain, weakness, or dizziness. There's been no trauma. There are no other known modifying factors.   Past Medical History  Diagnosis Date  . Ovarian cyst   . Opiate addiction    Past Surgical History  Procedure Laterality Date  . Tubal ligation     Family History  Problem Relation Age of Onset  . Rashes / Skin problems Mother    History  Substance Use Topics  . Smoking status: Current Every Day Smoker -- 1.00 packs/day    Types: Cigarettes  . Smokeless tobacco: Not on file  . Alcohol Use: No   OB History   Grav Para Term Preterm Abortions TAB SAB Ect Mult Living                 Review of Systems  All other systems reviewed and are negative.     Allergies  Ketorolac tromethamine and Tramadol  Home Medications   Prior to Admission medications   Medication Sig Start Date End Date Taking? Authorizing Provider  cloNIDine (CATAPRES) 0.2 MG tablet Take 1 tablet (0.2 mg total) by mouth 2 (two) times daily. 10/24/13   Flint MelterElliott L Wentz, MD  LORazepam (ATIVAN) 1 MG tablet Take 1 tablet (1 mg total) by mouth 3 (three) times daily as needed  for anxiety. 10/24/13   Flint MelterElliott L Wentz, MD   BP 122/89  Pulse 89  Temp(Src) 97.7 F (36.5 C) (Oral)  Resp 18  Ht 5\' 5"  (1.651 m)  Wt 155 lb (70.308 kg)  BMI 25.79 kg/m2  SpO2 97%  LMP 10/10/2013 Physical Exam  Nursing note and vitals reviewed. Constitutional: She is oriented to person, place, and time. She appears well-developed and well-nourished. She appears distressed (Anxious, uncomfortable).  HENT:  Head: Normocephalic and atraumatic.  Eyes: Conjunctivae and EOM are normal. Pupils are equal, round, and reactive to light.  Neck: Normal range of motion and phonation normal. Neck supple.  Cardiovascular: Normal rate, regular rhythm and intact distal pulses.   Pulmonary/Chest: Effort normal and breath sounds normal. She exhibits no tenderness.  Abdominal: Soft. She exhibits no distension. There is no tenderness. There is no guarding.  Musculoskeletal: Normal range of motion.  Neurological: She is alert and oriented to person, place, and time. She exhibits normal muscle tone.  Skin: Skin is warm and dry.  Psychiatric: Her behavior is normal. Judgment and thought content normal.  Anxious    ED Course  Procedures (including critical care time)  Medications  cloNIDine (CATAPRES) tablet 0.2 mg (0.2 mg Oral Given 10/24/13 2225)  LORazepam (ATIVAN) tablet 2 mg (not administered)    Patient Vitals for the past 24 hrs:  BP Temp Temp src Pulse Resp SpO2 Height Weight  10/24/13 1945 122/89 mmHg 97.7 F (36.5 C) Oral 89 18 97 % 5\' 5"  (1.651 m) 155 lb (70.308 kg)    9:43 PM Reevaluation with update and discussion. After initial assessment and treatment, an updated evaluation reveals she is more comfortable now, she understands the rationale for treatment as an outpatient. All questions answered.Mancel Bale. WENTZ,ELLIOTT L   Labs Review Labs Reviewed  COMPREHENSIVE METABOLIC PANEL - Abnormal; Notable for the following:    Potassium 3.3 (*)    BUN 4 (*)    All other components within normal  limits  SALICYLATE LEVEL - Abnormal; Notable for the following:    Salicylate Lvl <2.0 (*)    All other components within normal limits  ACETAMINOPHEN LEVEL  CBC  ETHANOL  URINE RAPID DRUG SCREEN (HOSP PERFORMED)  POC URINE PREG, ED    Imaging Review No results found.   EKG Interpretation None      MDM   Final diagnoses:  Opioid dependence with withdrawal    Opiate dependence, with early withdrawal symptoms. No associated comorbidities.  Nursing Notes Reviewed/ Care Coordinated Applicable Imaging Reviewed Interpretation of Laboratory Data incorporated into ED treatment  The patient appears reasonably screened and/or stabilized for discharge and I doubt any other medical condition or other [facility] requiring further screening, evaluation, or treatment in the ED at this time prior to discharge.  Plan: Home Medications- Clonidine, Ativan; Home Treatments- Stop Opiates; return here if the recommended treatment, does not improve the symptoms; Recommended follow up- OP treatment center of choice    Flint MelterElliott L Wentz, MD 10/24/13 2228

## 2013-10-24 NOTE — ED Notes (Signed)
Pt present with request for help with detox from opiates, mainly Opana. Last used yesterday. 20-100mg  usage per day.

## 2015-03-16 IMAGING — CT CT ABD-PELV W/ CM
2 of 3 series · 16 of 46 positions shown, 18 images · IV contrast (Omnipaque 300)
Comparison: 11/13/2011 ultrasound, 10/05/2011 CT

CLINICAL DATA: Right lower quadrant abdominal pain.

CT ABDOMEN AND PELVIS WITH CONTRAST
TECHNIQUE: Multidetector CT imaging of the abdomen and pelvis was
performed following the standard protocol during bolus
administration of intravenous contrast.
Contrast: 50mL OMNIPAQUE IOHEXOL 300 MG/ML  SOLN, 100mL OMNIPAQUE
IOHEXOL 300 MG/ML  SOLN

[Series 2: abd_pel_with 5.0 b40f · axial · 0.71mm/px · z∈[-472,-68]mm · 13 of 93 slices shown, 15 images]
[im 6/93  soft-tissue]
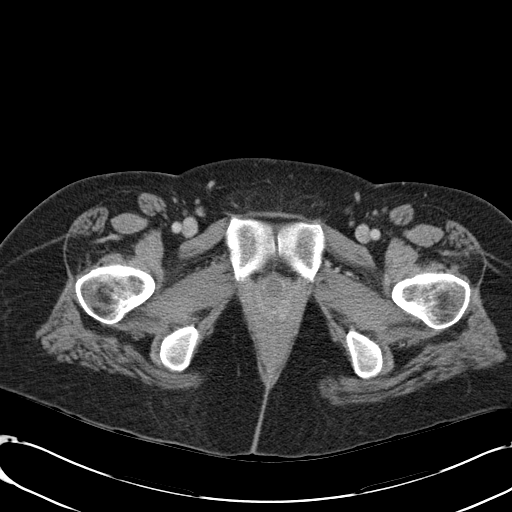
[im 6/93  bone]
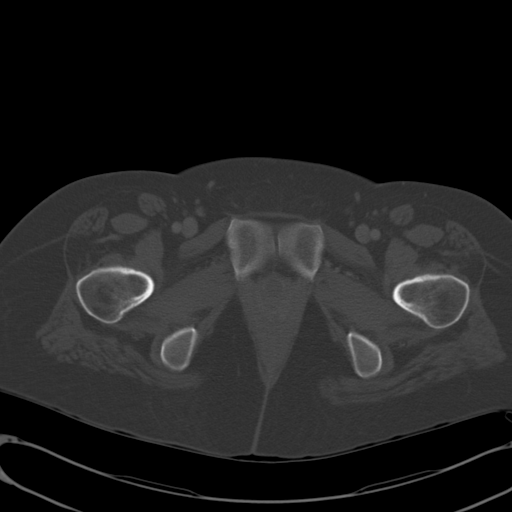
[im 12/93  soft-tissue]
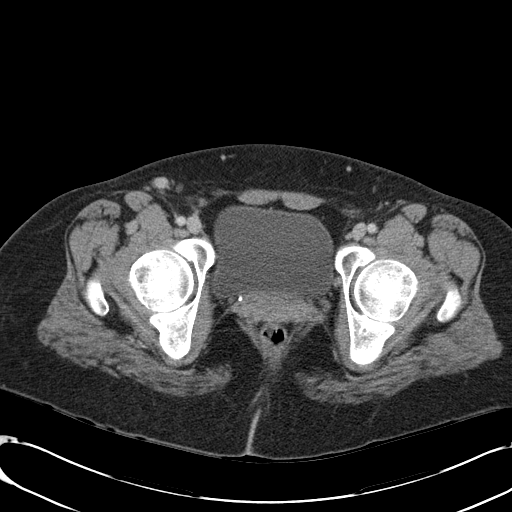
[im 18/93  soft-tissue]
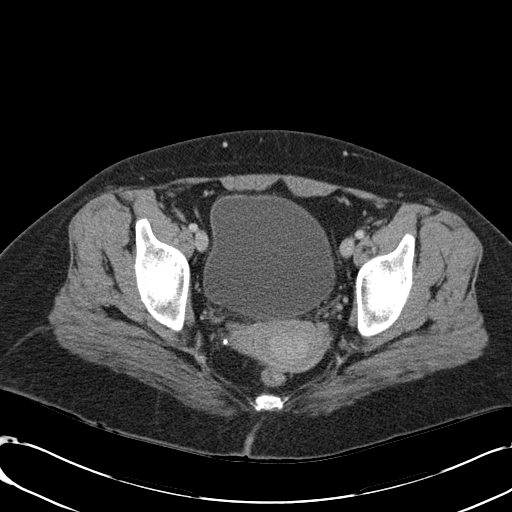
[im 27/93  soft-tissue]
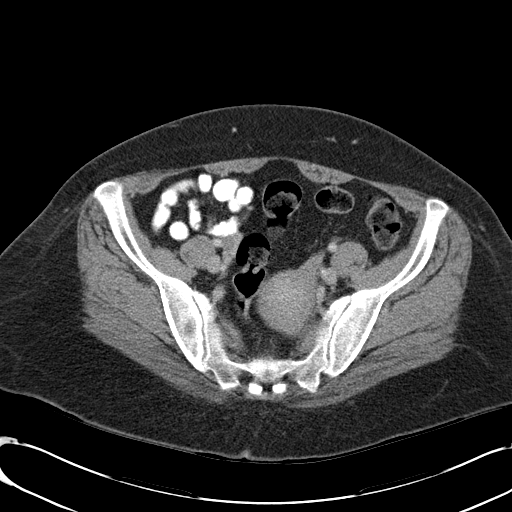
[im 33/93  soft-tissue]
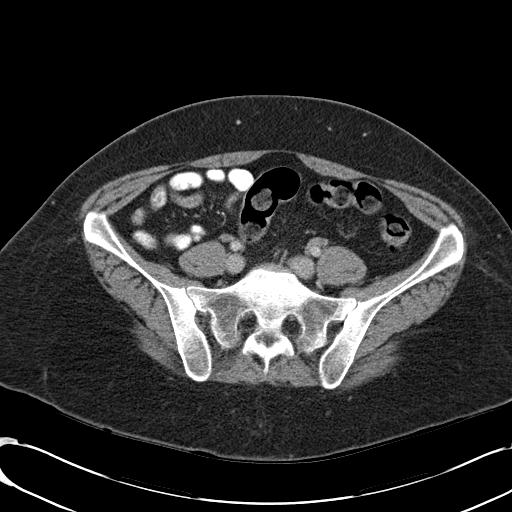
[im 39/93  soft-tissue]
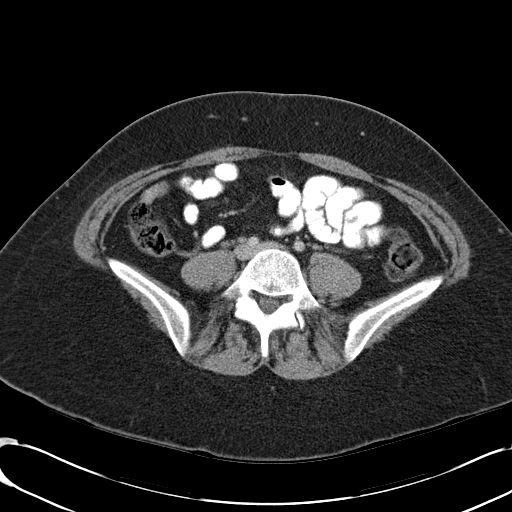
[im 48/93  soft-tissue]
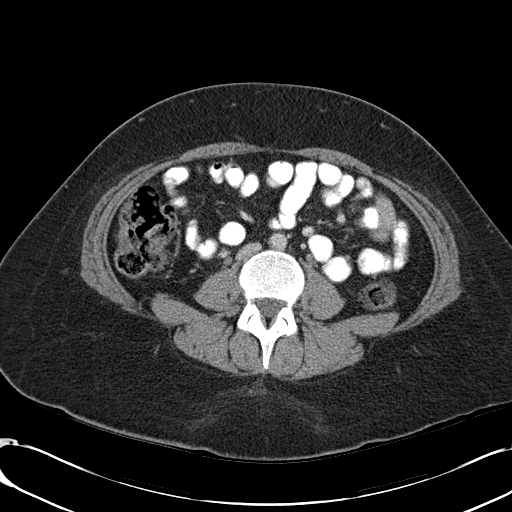
[im 54/93  soft-tissue]
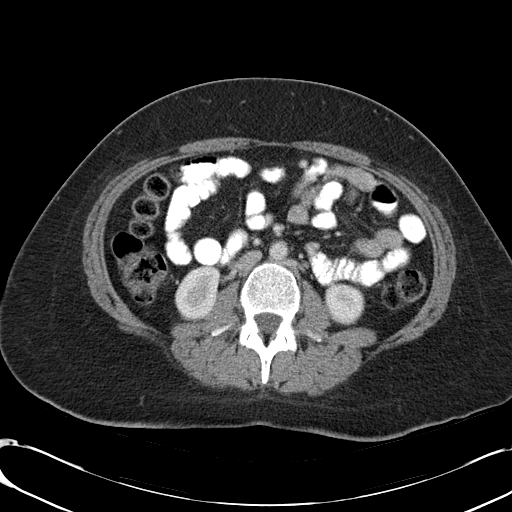
[im 60/93  soft-tissue]
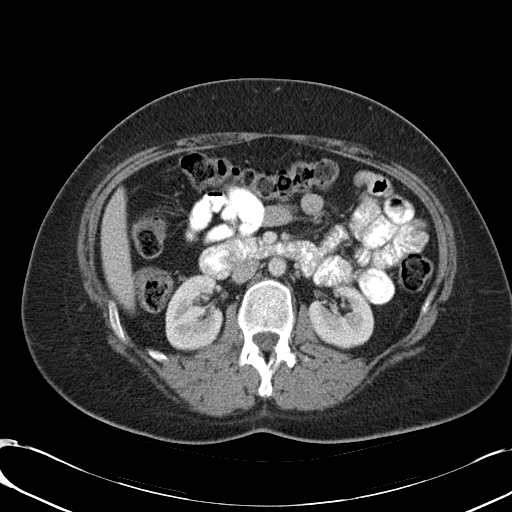
[im 60/93  bone]
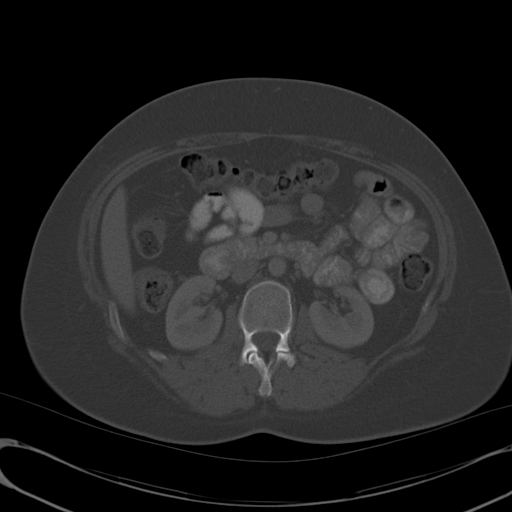
[im 66/93  soft-tissue]
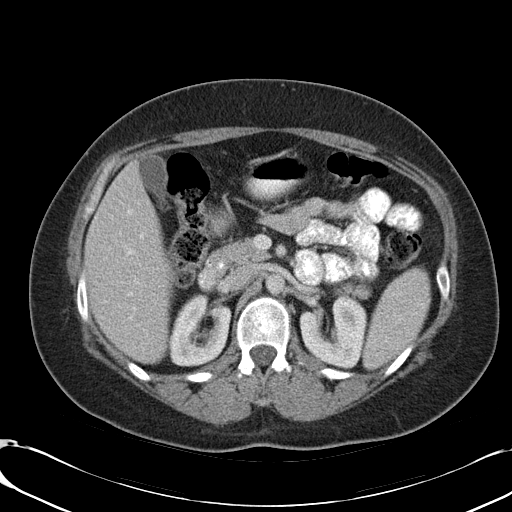
[im 75/93  soft-tissue]
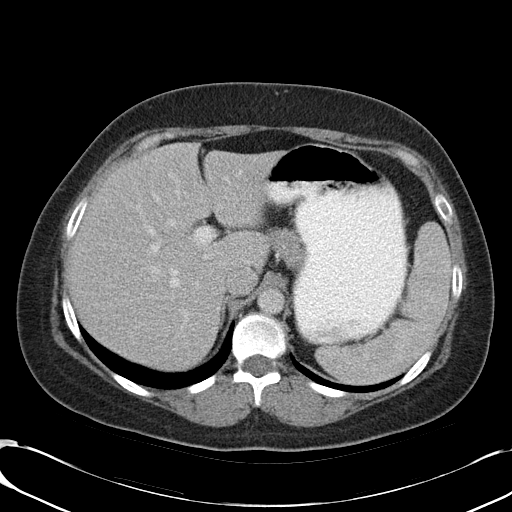
[im 81/93  soft-tissue]
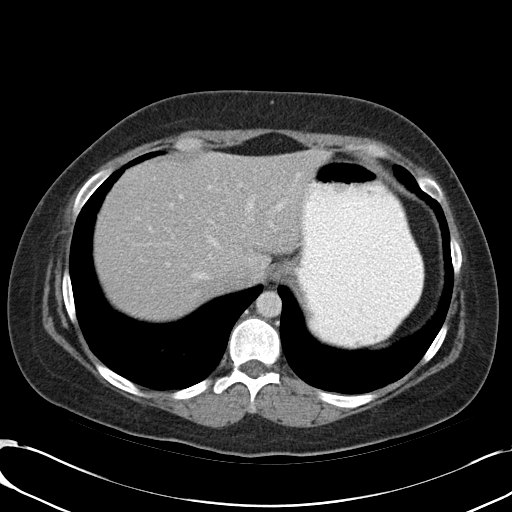
[im 87/93  soft-tissue]
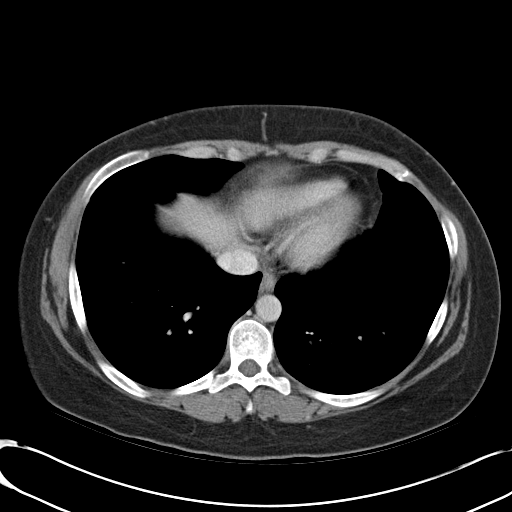

[Series 4: abd_pel_with 3.0 spo cor · coronal · 0.72mm/px · 3 of 93 slices shown]
[im 31/93  soft-tissue]
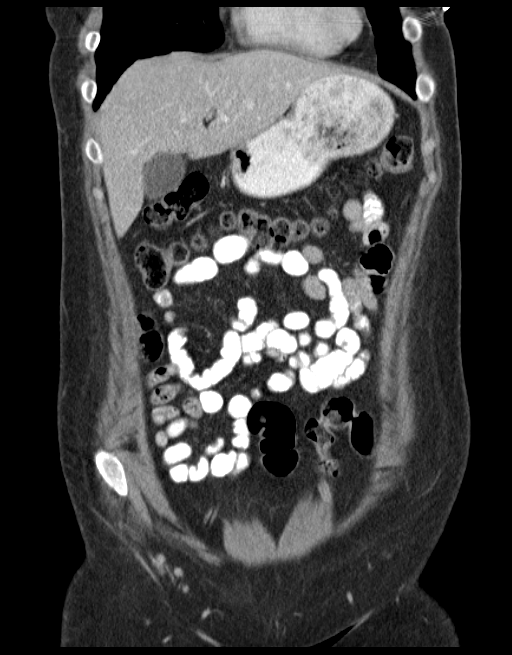
[im 41/93  soft-tissue]
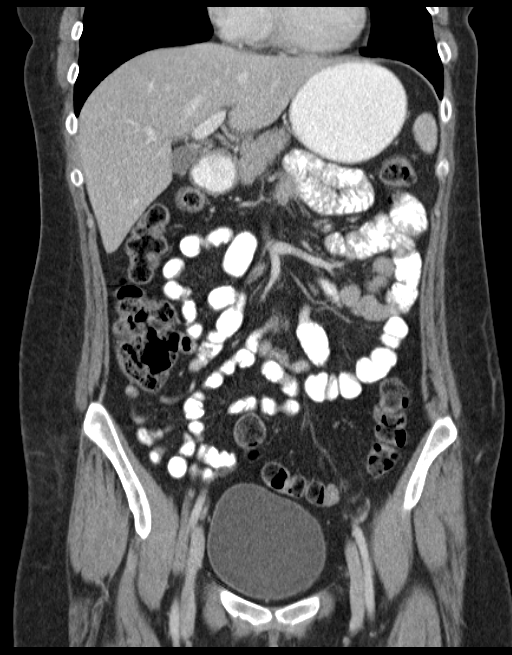
[im 52/93  soft-tissue]
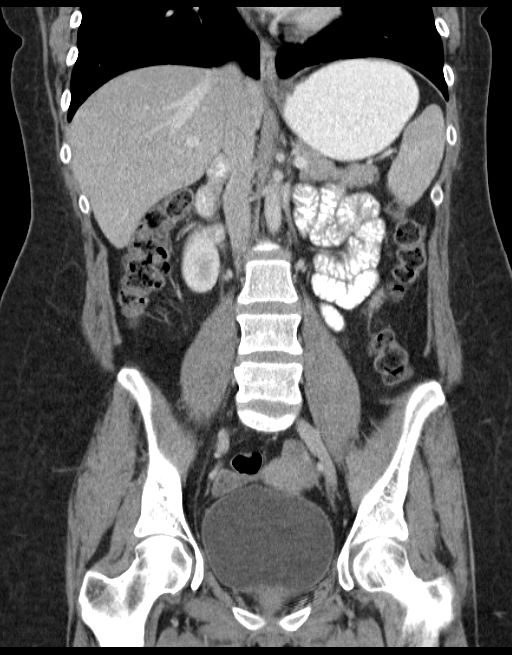

[16 of 46 positions shown; findings below may reference images not displayed]

FINDINGS: Limited images through the lung bases demonstrate no
significant appreciable abnormality. The heart size is within
normal limits. No pleural or pericardial effusion.

Mild decreased hepatic attenuation, nonspecific post contrast
however suggests steatosis.  Unchanged focal hypodensity adjacent
the falciform ligament, favored to reflect focal fat or variant
perfusion.

Unremarkable biliary system, spleen, pancreas, adrenal glands.

Symmetric renal enhancement.  No hydroureteronephrosis. Limited
post contrast evaluation for stones however no overt ureteral
calculi.

No colitis.  Normal appendix.  Small bowel loops are normal in
course and caliber.  No free intraperitoneal air or fluid.  No
lymphadenopathy.

Normal caliber aorta and branch vessels.

Thin-walled bladder.  Nonspecific evaluation of the uterus by CT,
appears similar to prior.  No adnexal mass.

Unchanged mild L1 vertebral body height loss.  Superior endplate
rounded defect at T11 has developed in the interval, favored to
reflect a Schmorl's node.
IMPRESSION: Normal appendix.  No acute abdominopelvic process identified by CT.

Mild hepatic steatosis suggested.

## 2015-07-20 ENCOUNTER — Encounter (HOSPITAL_COMMUNITY): Payer: Self-pay | Admitting: Emergency Medicine

## 2015-07-20 ENCOUNTER — Emergency Department (HOSPITAL_COMMUNITY): Payer: Self-pay

## 2015-07-20 ENCOUNTER — Emergency Department (HOSPITAL_COMMUNITY)
Admission: EM | Admit: 2015-07-20 | Discharge: 2015-07-20 | Disposition: A | Discharge status: Home / Self Care | Payer: Self-pay | Attending: Emergency Medicine | Admitting: Emergency Medicine

## 2015-07-20 DIAGNOSIS — F17210 Nicotine dependence, cigarettes, uncomplicated: Secondary | ICD-10-CM | POA: Insufficient documentation

## 2015-07-20 DIAGNOSIS — Y929 Unspecified place or not applicable: Secondary | ICD-10-CM | POA: Insufficient documentation

## 2015-07-20 DIAGNOSIS — S5002XA Contusion of left elbow, initial encounter: Secondary | ICD-10-CM | POA: Insufficient documentation

## 2015-07-20 DIAGNOSIS — Y939 Activity, unspecified: Secondary | ICD-10-CM | POA: Insufficient documentation

## 2015-07-20 DIAGNOSIS — W010XXA Fall on same level from slipping, tripping and stumbling without subsequent striking against object, initial encounter: Secondary | ICD-10-CM | POA: Insufficient documentation

## 2015-07-20 DIAGNOSIS — Y999 Unspecified external cause status: Secondary | ICD-10-CM | POA: Insufficient documentation

## 2015-07-20 MED ORDER — HYDROCODONE-ACETAMINOPHEN 5-325 MG PO TABS
2.0000 | ORAL_TABLET | ORAL | Status: AC | PRN
Start: 2015-07-20 — End: ?

## 2015-07-20 MED ORDER — IBUPROFEN 800 MG PO TABS: 800.0000 mg | ORAL_TABLET | Freq: Three times a day (TID) | ORAL | Status: AC

## 2015-07-20 MED ORDER — HYDROCODONE-ACETAMINOPHEN 5-325 MG PO TABS
2.0000 | ORAL_TABLET | Freq: Once | ORAL | Status: AC
  Administered 2015-07-20: 2 via ORAL
  Filled 2015-07-20: qty 2

## 2015-07-20 NOTE — ED Notes (Signed)
Injury to left elbow last night after tripping over blanket.  Rates pain 8/10.

## 2015-07-20 NOTE — ED Provider Notes (Signed)
CSN: [id number]     Arrival date & time 07/20/15  1136 History   First MD Initiated Contact with Patient 07/20/15 1218     Chief Complaint  Patient presents with  . Elbow Injury     (Consider location/radiation/quality/duration/timing/severity/associated sxs/prior Treatment) Patient is a 35 y.o. female presenting with arm injury. The history is provided by the patient. No language interpreter was used.  Arm Injury Location:  Elbow Time since incident:  1 day Injury: yes   Mechanism of injury: fall   Fall:    Fall occurred:  Standing   Impact surface:  Hard floor Elbow location:  L elbow Pain details:    Quality:  Aching   Radiates to:  Does not radiate   Severity:  Moderate   Timing:  Constant   Progression:  Worsening Chronicity:  New Dislocation: no   Foreign body present:  No foreign bodies Prior injury to area:  No Relieved by:  Nothing Worsened by:  Nothing tried Associated symptoms: no fever    Pt reports she fell and hit elbow Past Medical History  Diagnosis Date  . Ovarian cyst   . Opiate addiction [facility](HCC)    Past Surgical History  Procedure Laterality Date  . Tubal ligation     Family History  Problem Relation Age of Onset  . Rashes / Skin problems Mother    Social History  Substance Use Topics  . Smoking status: Current Every Day Smoker -- 1.00 packs/day    Types: Cigarettes  . Smokeless tobacco: None  . Alcohol Use: No   OB History    No data available     Review of Systems  Constitutional: Negative for fever.  All other systems reviewed and are negative.     Allergies  Ketorolac tromethamine and Tramadol  Home Medications   Prior to Admission medications   Not on File   BP 134/89 mmHg  Pulse 97  Temp(Src) 98.6 F (37 C) (Oral)  Resp 18  Ht 5\' 4"  (1.626 m)  Wt 65.772 kg  BMI 24.88 kg/m2  SpO2 100%  LMP 07/06/2015 Physical Exam  Constitutional: She is oriented to person, place, and time. She appears well-developed and  well-nourished.  HENT:  Head: Normocephalic and atraumatic.  Eyes: Conjunctivae and EOM are normal. Pupils are equal, round, and reactive to light.  Neck: Normal range of motion. Neck supple.  Cardiovascular: Normal rate.   Pulmonary/Chest: Effort normal.  Abdominal: Soft.  Musculoskeletal: She exhibits tenderness.  Neurological: She is alert and oriented to person, place, and time. She has normal reflexes.  Skin: Skin is warm.  Psychiatric: She has a normal mood and affect.  Nursing note and vitals reviewed.   ED Course  Procedures (including critical care time) Labs Review Labs Reviewed - No data to display  Imaging Review Dg Elbow Complete Left  07/20/2015  CLINICAL DATA:  Trip and fall yesterday with elbow pain, initial encounter EXAM: LEFT ELBOW - COMPLETE 3+ VIEW COMPARISON:  None. FINDINGS: There is no evidence of fracture, dislocation, or joint effusion. There is no evidence of arthropathy or other focal bone abnormality. Soft tissues are unremarkable. IMPRESSION: No acute abnormality noted. Electronically Signed   By: Alcide CleverMark  Lukens M.D.   On: 07/20/2015 12:14   I have personally reviewed and evaluated these images and lab results as part of my medical decision-making.   EKG Interpretation None      MDM Pt given a sling and 2 hydrocodone.    Final diagnoses:  Contusion, elbow, left, initial encounter    Hydrocodone 10 tablets An After Visit Summary was printed and given to the patient.    Adrienne Levy [place], PA-C 07/20/15 1257  Vanetta Mulders, MD 07/20/15 [phone]

## 2015-07-20 NOTE — Discharge Instructions (Signed)

## 2015-07-20 NOTE — ED Notes (Signed)
Sling applied. Tolerated medication. Ice pack applied.  Pt verbalized understanding of dc instructions prescriptions and follow up care. Steady gait. Mom at bedside.
